# Patient Record
Sex: Female | Born: 1970 | Hispanic: Yes | Marital: Married | Smoking: Never smoker
Health system: Southern US, Community
[De-identification: ages and names within clinical notes are randomized; demographics above are authoritative.]

## PROBLEM LIST (undated history)

## (undated) DIAGNOSIS — N189 Chronic kidney disease, unspecified: Secondary | ICD-10-CM

## (undated) DIAGNOSIS — N289 Disorder of kidney and ureter, unspecified: Secondary | ICD-10-CM

## (undated) DIAGNOSIS — M1990 Unspecified osteoarthritis, unspecified site: Secondary | ICD-10-CM

## (undated) DIAGNOSIS — E041 Nontoxic single thyroid nodule: Secondary | ICD-10-CM

## (undated) DIAGNOSIS — Q613 Polycystic kidney, unspecified: Secondary | ICD-10-CM

## (undated) DIAGNOSIS — F32A Depression, unspecified: Secondary | ICD-10-CM

## (undated) HISTORY — DX: Nontoxic single thyroid nodule: E04.1

## (undated) HISTORY — DX: Polycystic kidney, unspecified: Q61.3

## (undated) HISTORY — DX: Chronic kidney disease, unspecified: N18.9

## (undated) HISTORY — DX: Depression, unspecified: F32.A

## (undated) HISTORY — PX: LAPAROSCOPIC OVARIAN CYSTECTOMY: SUR786

## (undated) HISTORY — DX: Unspecified osteoarthritis, unspecified site: M19.90

## (undated) HISTORY — PX: ABDOMINAL HYSTERECTOMY: SHX81

---

## 1992-01-17 HISTORY — PX: TONSILLECTOMY: SUR1361

## 1995-01-17 DIAGNOSIS — R87619 Unspecified abnormal cytological findings in specimens from cervix uteri: Secondary | ICD-10-CM

## 1995-01-17 HISTORY — PX: CERVIX LESION DESTRUCTION: SHX591

## 1995-01-17 HISTORY — DX: Unspecified abnormal cytological findings in specimens from cervix uteri: R87.619

## 2011-01-17 DIAGNOSIS — M1990 Unspecified osteoarthritis, unspecified site: Secondary | ICD-10-CM

## 2011-01-17 HISTORY — DX: Unspecified osteoarthritis, unspecified site: M19.90

## 2011-01-17 HISTORY — PX: ABDOMINAL HYSTERECTOMY: SHX81

## 2011-01-17 HISTORY — PX: TOTAL LAPAROSCOPIC HYSTERECTOMY WITH SALPINGECTOMY: SHX6742

## 2014-12-15 DIAGNOSIS — R7989 Other specified abnormal findings of blood chemistry: Secondary | ICD-10-CM | POA: Insufficient documentation

## 2015-04-06 DIAGNOSIS — E041 Nontoxic single thyroid nodule: Secondary | ICD-10-CM | POA: Insufficient documentation

## 2015-11-22 DIAGNOSIS — F331 Major depressive disorder, recurrent, moderate: Secondary | ICD-10-CM | POA: Insufficient documentation

## 2016-09-11 DIAGNOSIS — Z90710 Acquired absence of both cervix and uterus: Secondary | ICD-10-CM | POA: Insufficient documentation

## 2017-01-16 DIAGNOSIS — E041 Nontoxic single thyroid nodule: Secondary | ICD-10-CM

## 2017-01-16 HISTORY — DX: Nontoxic single thyroid nodule: E04.1

## 2017-04-05 DIAGNOSIS — N182 Chronic kidney disease, stage 2 (mild): Secondary | ICD-10-CM | POA: Insufficient documentation

## 2017-04-05 DIAGNOSIS — Q613 Polycystic kidney, unspecified: Secondary | ICD-10-CM | POA: Insufficient documentation

## 2017-06-20 DIAGNOSIS — F430 Acute stress reaction: Secondary | ICD-10-CM | POA: Insufficient documentation

## 2018-02-14 DIAGNOSIS — R1013 Epigastric pain: Secondary | ICD-10-CM | POA: Insufficient documentation

## 2018-02-14 DIAGNOSIS — K5901 Slow transit constipation: Secondary | ICD-10-CM | POA: Insufficient documentation

## 2018-10-01 LAB — HM MAMMOGRAPHY

## 2019-09-12 ENCOUNTER — Encounter: Payer: Self-pay | Admitting: Family Medicine

## 2019-09-12 ENCOUNTER — Telehealth: Payer: Self-pay

## 2019-09-12 ENCOUNTER — Other Ambulatory Visit: Payer: Self-pay

## 2019-09-12 ENCOUNTER — Ambulatory Visit: Payer: PRIVATE HEALTH INSURANCE | Admitting: Family Medicine

## 2019-09-12 VITALS — BP 116/78 | HR 60 | Temp 98.2°F | Ht 64.5 in | Wt 130.8 lb

## 2019-09-12 DIAGNOSIS — Q613 Polycystic kidney, unspecified: Secondary | ICD-10-CM

## 2019-09-12 DIAGNOSIS — E041 Nontoxic single thyroid nodule: Secondary | ICD-10-CM | POA: Diagnosis not present

## 2019-09-12 DIAGNOSIS — Z13220 Encounter for screening for lipoid disorders: Secondary | ICD-10-CM | POA: Diagnosis not present

## 2019-09-12 DIAGNOSIS — Z90710 Acquired absence of both cervix and uterus: Secondary | ICD-10-CM

## 2019-09-12 DIAGNOSIS — K219 Gastro-esophageal reflux disease without esophagitis: Secondary | ICD-10-CM

## 2019-09-12 DIAGNOSIS — Z8619 Personal history of other infectious and parasitic diseases: Secondary | ICD-10-CM

## 2019-09-12 DIAGNOSIS — Z0000 Encounter for general adult medical examination without abnormal findings: Secondary | ICD-10-CM

## 2019-09-12 DIAGNOSIS — R1013 Epigastric pain: Secondary | ICD-10-CM

## 2019-09-12 DIAGNOSIS — R232 Flushing: Secondary | ICD-10-CM

## 2019-09-12 LAB — LIPID PANEL
Cholesterol: 204 mg/dL — ABNORMAL HIGH (ref 0–200)
HDL: 56 mg/dL (ref >39.00)
LDL Cholesterol: 132 mg/dL — ABNORMAL HIGH (ref 0–99)
NonHDL: 148.02
Total CHOL/HDL Ratio: 4
Triglycerides: 81 mg/dL (ref 0.0–149.0)
VLDL: 16.2 mg/dL (ref 0.0–40.0)

## 2019-09-12 LAB — T4, FREE: Free T4: 0.89 ng/dL (ref 0.60–1.60)

## 2019-09-12 LAB — CBC WITH DIFFERENTIAL/PLATELET
Basophils Absolute: 0.1 10*3/uL (ref 0.0–0.1)
Basophils Relative: 1.1 % (ref 0.0–3.0)
Eosinophils Absolute: 0.2 10*3/uL (ref 0.0–0.7)
Eosinophils Relative: 1.9 % (ref 0.0–5.0)
HCT: 39.6 % (ref 36.0–46.0)
Hemoglobin: 13.3 g/dL (ref 12.0–15.0)
Lymphocytes Relative: 34.1 % (ref 12.0–46.0)
Lymphs Abs: 2.8 10*3/uL (ref 0.7–4.0)
MCHC: 33.7 g/dL (ref 30.0–36.0)
MCV: 87.3 fl (ref 78.0–100.0)
Monocytes Absolute: 0.4 10*3/uL (ref 0.1–1.0)
Monocytes Relative: 5.5 % (ref 3.0–12.0)
Neutro Abs: 4.7 10*3/uL (ref 1.4–7.7)
Neutrophils Relative %: 57.4 % (ref 43.0–77.0)
Platelets: 340 10*3/uL (ref 150.0–400.0)
RBC: 4.54 Mil/uL (ref 3.87–5.11)
RDW: 13.3 % (ref 11.5–15.5)
WBC: 8.2 10*3/uL (ref 4.0–10.5)

## 2019-09-12 LAB — COMPREHENSIVE METABOLIC PANEL
ALT: 14 U/L (ref 0–35)
AST: 17 U/L (ref 0–37)
Albumin: 4.5 g/dL (ref 3.5–5.2)
Alkaline Phosphatase: 58 U/L (ref 39–117)
BUN: 17 mg/dL (ref 6–23)
CO2: 26 mEq/L (ref 19–32)
Calcium: 10.1 mg/dL (ref 8.4–10.5)
Chloride: 104 mEq/L (ref 96–112)
Creatinine, Ser: 1.06 mg/dL (ref 0.40–1.20)
GFR: 54.86 mL/min — ABNORMAL LOW (ref >60.00)
Glucose, Bld: 87 mg/dL (ref 70–99)
Potassium: 4.8 mEq/L (ref 3.5–5.1)
Sodium: 140 mEq/L (ref 135–145)
Total Bilirubin: 0.7 mg/dL (ref 0.2–1.2)
Total Protein: 7.4 g/dL (ref 6.0–8.3)

## 2019-09-12 LAB — FOLLICLE STIMULATING HORMONE: FSH: >206 m[IU]/mL

## 2019-09-12 LAB — TSH: TSH: 0.35 u[IU]/mL (ref 0.35–4.50)

## 2019-09-12 LAB — VITAMIN D 25 HYDROXY (VIT D DEFICIENCY, FRACTURES): VITD: 87.61 ng/mL (ref 30.00–100.00)

## 2019-09-12 MED ORDER — SERTRALINE HCL 50 MG PO TABS: 75.0000 mg | ORAL_TABLET | Freq: Every day | ORAL | 3 refills | Status: DC

## 2019-09-12 MED ORDER — ESTRADIOL 1 MG PO TABS: 1.0000 mg | ORAL_TABLET | Freq: Every day | ORAL | 3 refills | Status: DC

## 2019-09-12 MED ORDER — VALACYCLOVIR HCL 500 MG PO TABS: 1000.0000 mg | ORAL_TABLET | ORAL | 1 refills | Status: DC | PRN

## 2019-09-12 NOTE — Telephone Encounter (Signed)
Request for Mammogram.

## 2019-09-12 NOTE — Progress Notes (Signed)
Subjective:    Patient ID: Diane Hood, female    DOB: 1970-02-09, 49 y.o.   MRN: [id number]  HPI Chief Complaint  Patient presents with  . New Patient (Initial Visit)    colon-2019--will bring copy.Marland KitchenMarland KitchenMamm--08/2019--[facility]--[place], [place]...   New to the area, moved in December. Lives with her husband and two children, one daughter, one son, 70, 83. Homemaker. Enjoys walking, yoga  Last CPE- within last year Mammo- 2020 Pap- within last 2 years Colonoscopy- 2019- per patient Tdap- unknown Flu- annual Covid 19 vaccine- fully vaccinated Eye- overdue Dental- regular Exercise- walking, yoga  CKD/ polycystic kidney disease- needs nephrology referral. Last kidney ultrasound unknown. Last follow up approximately 1 year ago. Reports that her kidney function has been normal.   "Hot nodule" of thyroid- no recent labs. No difficulty with swallowing.   History of hysterectomy- some hot flashes, no history of cancer.   Upper epigastric pain-occasional with bloating, worse with certain foods.  Takes probiotic.  Has been on omeprazole daily for intermittent heartburn.  Heartburn is currently very occasional.  Omeprazole does not help with epigastric symptoms.  Normal bowel movements without blood or mucus.  History of hysterectomy-occasional hot flashes, mood changes.  Has been on Estrace and sertraline for this.  Feels like she could use a higher dose of sertraline to help with symptoms.  History of cold sores a couple of times a year.  Requests refill of valacyclovir as needed.  Review of Systems  Constitutional: Negative.   HENT: Negative.   Eyes: Negative.   Respiratory: Negative.   Cardiovascular: Negative.   Gastrointestinal: Positive for abdominal pain.  Endocrine: Negative.   Genitourinary: Negative.   Musculoskeletal: Negative.   Skin: Negative.   Allergic/Immunologic: Positive for environmental allergies (intermittent, relieved with otc treatment).   Neurological: Negative.   Hematological: Negative.   Psychiatric/Behavioral: Positive for sleep disturbance. Negative for dysphoric mood. The patient is not nervous/anxious.        Objective:   Physical Exam Physical Exam  Constitutional: She is oriented to person, place, and time. She appears well-developed and well-nourished. No distress.  HENT:  Head: Normocephalic and atraumatic.  Right Ear: External ear normal. TM normal.  Left Ear: External ear normal. TM normal.  Nose: Nose normal.  Mouth/Throat: Oropharynx is clear and moist. No oropharyngeal exudate.  Eyes: Conjunctivae are normal.   Neck: Normal range of motion. Neck supple. No JVD present. No thyromegaly present.  Cardiovascular: Normal rate, regular rhythm, normal heart sounds and intact distal pulses.   Pulmonary/Chest: Effort normal and breath sounds normal.  Abdominal: Soft. Bowel sounds are normal. She exhibits no distension and no mass. There is no tenderness. There is no rebound and no guarding.  Musculoskeletal: Normal range of motion. She exhibits no edema or tenderness.  Lymphadenopathy:    She has no cervical adenopathy.  Neurological: She is alert and oriented to person, place, and time.   Skin: Skin is warm and dry. She is not diaphoretic.  Psychiatric: She has a normal mood and affect. Her behavior is normal. Judgment and thought content normal.  Vitals reviewed.    BP 116/78   Pulse 60   Temp 98.2 F (36.8 C) (Temporal)   Ht 5' 4.5" (1.638 m)   Wt 130 lb 12 oz (59.3 kg)   SpO2 99%   BMI 22.10 kg/m   Depression screen [facility] 2/9 09/12/2019  Decreased Interest 0  Down, Depressed, Hopeless 0  PHQ - 2 Score 0  Assessment & Plan:  1. Annual physical exam - Discussed and encouraged healthy lifestyle choices- adequate sleep, regular exercise, stress management and healthy food choices.  -We will obtain records from prior PCP and nephrologist  2. Polycystic kidney disease - Ambulatory referral  to Nephrology - Comprehensive metabolic panel - CBC with Differential - Vitamin D, 25-hydroxy  3. Screening for lipid disorders - Lipid Panel  4. Thyroid nodule - TSH - T4, Free - T3; Future  5. History of hysterectomy - Follicle stimulating hormone  6. H/O cold sores - valACYclovir (VALTREX) 500 MG tablet; Take 2 tablets (1,000 mg total) by mouth as needed.  Dispense: 20 tablet; Refill: 1  7. Vasomotor flushing - sertraline (ZOLOFT) 50 MG tablet; Take 1.5 tablets (75 mg total) by mouth daily.  Dispense: 135 tablet; Refill: 3 - estradiol (ESTRACE) 1 MG tablet; Take 1 tablet (1 mg total) by mouth daily.  Dispense: 90 tablet; Refill: 3 -We will continue estradiol, increase sertraline from 50 mg to 75 mg.  Follow-up in 6 months.  8.  Epigastric pain -Intermittent and longstanding.  Discussed trigger avoidance, can continue probiotics -If any worsening or no improvement, consider ultrasound  9.  GERD -Symptoms infrequent, discussed weaning daily omeprazole to every other day for a while and then just as needed  This visit occurred during the SARS-CoV-2 public health emergency.  Safety protocols were in place, including screening questions prior to the visit, additional usage of staff PPE, and extensive cleaning of exam room while observing appropriate contact time as indicated for disinfecting solutions.    Olean Ree, FNP-BC  [facility] [address] [facility], [facility]  09/14/2019 8:32 AM

## 2019-09-12 NOTE — Patient Instructions (Addendum)
Please call and schedule an appointment for screening mammogram. A referral is not needed.  [phone] [phone]  I have put in referral to nephrologist.   Try 1-2 teaspoons of apple cider vinegar in your water daily  Try to decrease your omperazole to every other day then use only as needed  Follow up in 6 months

## 2019-09-14 ENCOUNTER — Encounter: Payer: Self-pay | Admitting: Family Medicine

## 2019-11-12 DIAGNOSIS — N1831 Chronic kidney disease, stage 3a: Secondary | ICD-10-CM | POA: Insufficient documentation

## 2019-11-18 ENCOUNTER — Other Ambulatory Visit (HOSPITAL_COMMUNITY): Payer: Self-pay | Admitting: Nephrology

## 2019-11-18 ENCOUNTER — Other Ambulatory Visit: Payer: Self-pay | Admitting: Nephrology

## 2019-11-18 DIAGNOSIS — N1831 Chronic kidney disease, stage 3a: Secondary | ICD-10-CM

## 2019-11-18 DIAGNOSIS — R8290 Unspecified abnormal findings in urine: Secondary | ICD-10-CM

## 2019-11-18 DIAGNOSIS — Q612 Polycystic kidney, adult type: Secondary | ICD-10-CM

## 2019-11-24 ENCOUNTER — Encounter: Payer: Self-pay | Admitting: Family Medicine

## 2019-12-04 ENCOUNTER — Other Ambulatory Visit: Payer: Self-pay | Admitting: Family Medicine

## 2019-12-04 DIAGNOSIS — Z8619 Personal history of other infectious and parasitic diseases: Secondary | ICD-10-CM

## 2020-01-29 ENCOUNTER — Ambulatory Visit
Admission: RE | Admit: 2020-01-29 | Discharge: 2020-01-29 | Disposition: A | Discharge status: Home / Self Care | Payer: PRIVATE HEALTH INSURANCE | Source: Ambulatory Visit | Attending: Nephrology | Admitting: Nephrology

## 2020-01-29 ENCOUNTER — Other Ambulatory Visit: Payer: Self-pay

## 2020-01-29 DIAGNOSIS — R8290 Unspecified abnormal findings in urine: Secondary | ICD-10-CM | POA: Diagnosis present

## 2020-01-29 DIAGNOSIS — N1831 Chronic kidney disease, stage 3a: Secondary | ICD-10-CM | POA: Diagnosis present

## 2020-01-29 DIAGNOSIS — Q612 Polycystic kidney, adult type: Secondary | ICD-10-CM | POA: Insufficient documentation

## 2020-02-11 ENCOUNTER — Telehealth: Payer: Self-pay | Admitting: Family Medicine

## 2020-02-11 NOTE — Telephone Encounter (Signed)
Ok to do. Thanks.  

## 2020-02-11 NOTE — Telephone Encounter (Signed)
Patient is doing a TOC to you. Patient wants to know if you would be interested in taking her husband on as a new patient? Please advise. Thank you, EM

## 2020-03-15 ENCOUNTER — Ambulatory Visit: Payer: PRIVATE HEALTH INSURANCE | Admitting: Family Medicine

## 2020-04-13 ENCOUNTER — Other Ambulatory Visit: Payer: Self-pay

## 2020-04-13 ENCOUNTER — Encounter: Payer: Self-pay | Admitting: Family Medicine

## 2020-04-13 ENCOUNTER — Ambulatory Visit: Payer: PRIVATE HEALTH INSURANCE | Admitting: Family Medicine

## 2020-04-13 VITALS — BP 110/70 | HR 77 | Temp 98.2°F | Ht 64.5 in | Wt 131.3 lb

## 2020-04-13 DIAGNOSIS — Q613 Polycystic kidney, unspecified: Secondary | ICD-10-CM

## 2020-04-13 DIAGNOSIS — E0590 Thyrotoxicosis, unspecified without thyrotoxic crisis or storm: Secondary | ICD-10-CM | POA: Diagnosis not present

## 2020-04-13 DIAGNOSIS — N182 Chronic kidney disease, stage 2 (mild): Secondary | ICD-10-CM | POA: Diagnosis not present

## 2020-04-13 DIAGNOSIS — J309 Allergic rhinitis, unspecified: Secondary | ICD-10-CM | POA: Diagnosis not present

## 2020-04-13 MED ORDER — OMEPRAZOLE 20 MG PO CPDR: 20.0000 mg | DELAYED_RELEASE_CAPSULE | Freq: Every day | ORAL | 6 refills | Status: DC | PRN

## 2020-04-13 NOTE — Patient Instructions (Addendum)
Laboratorios hoy para revisar nivel de la tiroides. La remitiremos a endocrinologo para establecer para tiroides sobreactivo.  Gusto verla hoy! Regresar en septiembre para proximo fisico

## 2020-04-13 NOTE — Progress Notes (Signed)
Patient ID: Diane Hood, female    DOB: September 11, 1970, 50 y.o.   MRN: [id number]  This visit was conducted in person.  BP 110/70   Pulse 77   Temp 98.2 F (36.8 C) (Temporal)   Ht 5' 4.5" (1.638 m)   Wt 131 lb 5 oz (59.6 kg)   SpO2 97%   BMI 22.19 kg/m    CC: transfer care  Subjective:   HPI: Diane Hood is a 50 y.o. female presenting on 04/13/2020 for Transitions Of Care (TOC from [place]. )   Previously saw Deboraha Sprang.  Last CPE 08/2019.   CKD /polycystic kidney disease followed by nephrologist [facility]) - no need for medications at this time. Lab work Q6 months.   Subclinical hyperthyroidism - brings records latest thyroid uptake scan: Nuclear thyroid uptake scan 11/2017 - mod enlarged gland, fairly homogenous uptake throughout except for a focal area of increased uptake at L lower pole corresponding to sonographically demonstrated L lower pole nodule. 6 hour radioactive iodine uptake is normal at 14%, 24 hour radioactive iodine uptake is also normal at 29%.  Denies diarrhea, constipation. Ongoing hemorrhoids.  Good fiber intake.   Perimenopausal - ongoing hot flashes. S/p hysterectomy age 50.   Predominantly notes hands and feet stay cold.  Notes easy abdominal distension with certain foods.   Allergies - manages with zyrtec, flonase.   3 mo h/o L toenail change  Colonoscopy 2018 - WNL in [place] to the area 12/2018 from [place] [facility]) prior lived in [place] Lives with husband 2 children  Occ: housewife  Activity: yoga, walking Diet: good water, fruits/vegetables daily      Relevant past medical, surgical, family and social history reviewed and updated as indicated. Interim medical history since our last visit reviewed. Allergies and medications reviewed and updated. Outpatient Medications Prior to Visit  Medication Sig Dispense Refill  . Cholecalciferol 125 MCG (5000 UT) TABS 1 Tablet(s) P.O. daily    . estradiol (ESTRACE) 1 MG  tablet Take 1 tablet (1 mg total) by mouth daily. (Patient taking differently: Take 1 mg by mouth daily. As needed) 90 tablet 3  . sertraline (ZOLOFT) 50 MG tablet Take 1.5 tablets (75 mg total) by mouth daily. (Patient taking differently: Take 75 mg by mouth daily. As needed) 135 tablet 3  . valACYclovir (VALTREX) 500 MG tablet TAKE 2 TABLETS BY MOUTH AS NEEDED 20 tablet 1  . omeprazole (PRILOSEC) 20 MG capsule As needed     No facility-administered medications prior to visit.     Per HPI unless specifically indicated in ROS section below Review of Systems Objective:  BP 110/70   Pulse 77   Temp 98.2 F (36.8 C) (Temporal)   Ht 5' 4.5" (1.638 m)   Wt 131 lb 5 oz (59.6 kg)   SpO2 97%   BMI 22.19 kg/m   Wt Readings from Last 3 Encounters:  04/13/20 131 lb 5 oz (59.6 kg)  09/12/19 130 lb 12 oz (59.3 kg)      Physical Exam Vitals and nursing note reviewed.  Constitutional:      Appearance: Normal appearance. She is not ill-appearing.  Neck:     Thyroid: No thyroid mass, thyromegaly or thyroid tenderness.  Cardiovascular:     Rate and Rhythm: Regular rhythm. Tachycardia present.     Pulses: Normal pulses.     Heart sounds: Normal heart sounds. No murmur heard.   Pulmonary:     Effort: Pulmonary effort is normal.  No respiratory distress.     Breath sounds: Normal breath sounds. No wheezing, rhonchi or rales.  Skin:    General: Skin is warm and dry.     Findings: No rash.  Neurological:     Mental Status: She is alert.  Psychiatric:        Mood and Affect: Mood normal.        Behavior: Behavior normal.       Results for orders placed or performed in visit on 04/13/20  TSH  Result Value Ref Range   TSH 0.64 0.35 - 4.50 uIU/mL  T3  Result Value Ref Range   T3, Total 121 76 - 181 ng/dL  T4, free  Result Value Ref Range   Free T4 0.88 0.60 - 1.60 ng/dL   Assessment & Plan:  This visit occurred during the SARS-CoV-2 public health emergency.  Safety protocols were  in place, including screening questions prior to the visit, additional usage of staff PPE, and extensive cleaning of exam room while observing appropriate contact time as indicated for disinfecting solutions.   Problem List Items Addressed This Visit    Chronic kidney disease, stage 2 (mild)    Continue to monitor in h/o PCKD      Polycystic kidney    Followed by nephrology ([facility]) q53mo No need for medication at this time.       Subclinical hyperthyroidism - Primary    H/o subclinical hypothyroidism with hot L lower thyroid nodule on thyroid scintigraphy 2019. Update thyroid function. Discussed endo referral for further management.       Relevant Orders   TSH (Completed)   T3 (Completed)   T4, free (Completed)   Ambulatory referral to Endocrinology   Allergic rhinitis    Managed with OTC remedies  Discussed nasal saline irrigation and allergen avoidance measures          Meds ordered this encounter  Medications  . omeprazole (PRILOSEC) 20 MG capsule    Sig: Take 1 capsule (20 mg total) by mouth daily as needed (heartburn).    Dispense:  30 capsule    Refill:  6  . cetirizine (ZYRTEC) 10 MG tablet    Sig: Take 1 tablet (10 mg total) by mouth daily.  . fluticasone (FLONASE) 50 MCG/ACT nasal spray    Sig: Place 2 sprays into both nostrils daily.   Orders Placed This Encounter  Procedures  . TSH  . T3  . T4, free  . Ambulatory referral to Endocrinology    Referral Priority:   Routine    Referral Type:   Consultation    Referral Reason:   Specialty Services Required    Number of Visits Requested:   1    Patient Instructions  Laboratorios hoy para revisar nivel de la tiroides. La remitiremos a endocrinologo para establecer para tiroides sobreactivo.  Gusto verla hoy! Regresar en septiembre para proximo fisico    Follow up plan: Return if symptoms worsen or fail to improve.  Eustaquio Boyden, MD

## 2020-04-14 LAB — TSH: TSH: 0.64 u[IU]/mL (ref 0.35–4.50)

## 2020-04-14 LAB — T3: T3, Total: 121 ng/dL (ref 76–181)

## 2020-04-14 LAB — T4, FREE: Free T4: 0.88 ng/dL (ref 0.60–1.60)

## 2020-04-15 DIAGNOSIS — E0590 Thyrotoxicosis, unspecified without thyrotoxic crisis or storm: Secondary | ICD-10-CM | POA: Insufficient documentation

## 2020-04-15 DIAGNOSIS — J309 Allergic rhinitis, unspecified: Secondary | ICD-10-CM | POA: Insufficient documentation

## 2020-04-15 MED ORDER — CETIRIZINE HCL 10 MG PO TABS: 10.0000 mg | ORAL_TABLET | Freq: Every day | ORAL | Status: DC

## 2020-04-15 MED ORDER — FLUTICASONE PROPIONATE 50 MCG/ACT NA SUSP: 2.0000 | Freq: Every day | NASAL | Status: DC

## 2020-04-15 NOTE — Assessment & Plan Note (Signed)
Continue to monitor in h/o PCKD

## 2020-04-15 NOTE — Assessment & Plan Note (Addendum)
Managed with OTC remedies  Discussed nasal saline irrigation and allergen avoidance measures

## 2020-04-15 NOTE — Assessment & Plan Note (Addendum)
Followed by nephrology (Kolluru) q94mo No need for medication at this time.

## 2020-04-15 NOTE — Assessment & Plan Note (Signed)
H/o subclinical hypothyroidism with hot L lower thyroid nodule on thyroid scintigraphy 2019. Update thyroid function. Discussed endo referral for further management.

## 2020-05-19 ENCOUNTER — Encounter: Payer: Self-pay | Admitting: Family Medicine

## 2020-05-20 NOTE — Telephone Encounter (Signed)
Noticing inflamed stomach and blood with wiping. UTD colonoscopy 2018  Advised to call and schedule appt.

## 2020-05-20 NOTE — Telephone Encounter (Signed)
plz schedule next available appt with any provider for abd pain.

## 2020-05-21 NOTE — Telephone Encounter (Signed)
Called and offered patient appt in [place] with DR Saguier today at 11:30 patient denied visit and wanted to see Dr Diane Hood at next available. I even offered someone else here and she denied the visit

## 2020-05-25 NOTE — Telephone Encounter (Signed)
Please schedule appt for tomorrow as I have some openings then.

## 2020-05-26 ENCOUNTER — Other Ambulatory Visit: Payer: Self-pay

## 2020-05-26 ENCOUNTER — Ambulatory Visit
Admission: RE | Admit: 2020-05-26 | Discharge: 2020-05-26 | Disposition: A | Discharge status: Home / Self Care | Payer: PRIVATE HEALTH INSURANCE | Source: Ambulatory Visit | Attending: Family Medicine | Admitting: Family Medicine

## 2020-05-26 ENCOUNTER — Encounter: Payer: Self-pay | Admitting: Family Medicine

## 2020-05-26 ENCOUNTER — Ambulatory Visit: Payer: PRIVATE HEALTH INSURANCE | Admitting: Family Medicine

## 2020-05-26 VITALS — BP 122/80 | HR 64 | Temp 98.0°F | Ht 64.5 in | Wt 132.3 lb

## 2020-05-26 DIAGNOSIS — R911 Solitary pulmonary nodule: Secondary | ICD-10-CM | POA: Insufficient documentation

## 2020-05-26 DIAGNOSIS — N182 Chronic kidney disease, stage 2 (mild): Secondary | ICD-10-CM | POA: Diagnosis not present

## 2020-05-26 DIAGNOSIS — R1013 Epigastric pain: Secondary | ICD-10-CM | POA: Insufficient documentation

## 2020-05-26 DIAGNOSIS — R140 Abdominal distension (gaseous): Secondary | ICD-10-CM | POA: Insufficient documentation

## 2020-05-26 DIAGNOSIS — Q613 Polycystic kidney, unspecified: Secondary | ICD-10-CM | POA: Diagnosis not present

## 2020-05-26 HISTORY — DX: Disorder of kidney and ureter, unspecified: N28.9

## 2020-05-26 LAB — POC URINALSYSI DIPSTICK (AUTOMATED)
Bilirubin, UA: NEGATIVE
Blood, UA: NEGATIVE
Glucose, UA: NEGATIVE
Ketones, UA: NEGATIVE
Leukocytes, UA: NEGATIVE
Nitrite, UA: NEGATIVE
Protein, UA: NEGATIVE
Spec Grav, UA: 1.015 (ref 1.010–1.025)
Urobilinogen, UA: 0.2 E.U./dL
pH, UA: 7.5 (ref 5.0–8.0)

## 2020-05-26 LAB — POCT I-STAT CREATININE: Creatinine, Ser: 1 mg/dL (ref 0.44–1.00)

## 2020-05-26 MED ORDER — IOHEXOL 300 MG/ML  SOLN
100.0000 mL | Freq: Once | INTRAMUSCULAR | Status: AC | PRN
  Administered 2020-05-26: 100 mL via INTRAVENOUS

## 2020-05-26 NOTE — Progress Notes (Addendum)
Patient ID: Diane Hood, female    DOB: 1970-08-19, 50 y.o.   MRN: [id number]  This visit was conducted in person.  BP 122/80   Pulse 64   Temp 98 F (36.7 C) (Temporal)   Ht 5' 4.5" (1.638 m)   Wt 132 lb 5 oz (60 kg)   SpO2 99%   BMI 22.36 kg/m    CC: bloating, gassiness, abd pain  Subjective:   HPI: Diane Hood is a 50 y.o. female presenting on 05/26/2020 for GI Problem (C/o bloating, excessive flatulence and abd pain in umbilical region.  Started about 3 wks ago. )   3 wk h/o epigastric abd pain associated with generalized abdominal bloating to periumbilical region. Describes pressure like pain with radiation throughout abdomen. no boring pain to back, no significant RUQ pain. Symptoms acutely worse yesterday. Some blood with wiping that has since resolved (h/o int hem).   She restarted omeprazole 20mg  daily for almost 1 week without benefit.  No fevers/chills, nausea/vomiting, diarrhea, constipation, change in appetite, unexpected weight changes. No UTI symptoms.  No significant GERD/heartburn.  No recent travel.   Over the past few weeks has sharp pain to left flank like someone hit her - attriubutes to ruptured kidney cysts -  area stays sore for a few days. Treats with tylenol, increased water intake.   Similar symptoms 2007 - found to have H pylori after trip to [place].  Uses estradiol 1mg  daily PRN hot flashes.   Colonoscopy 2018 - WNL in S/p hysterectomy      Relevant past medical, surgical, family and social history reviewed and updated as indicated. Interim medical history since our last visit reviewed. Allergies and medications reviewed and updated. Outpatient Medications Prior to Visit  Medication Sig Dispense Refill  . cetirizine (ZYRTEC) 10 MG tablet Take 1 tablet (10 mg total) by mouth daily. (Patient taking differently: Take 10 mg by mouth daily. As needed)    . Cholecalciferol 125 MCG (5000 UT) TABS 1 Tablet(s) P.O. daily    .  estradiol (ESTRACE) 1 MG tablet Take 1 tablet (1 mg total) by mouth daily. (Patient taking differently: Take 1 mg by mouth daily. As needed) 90 tablet 3  . fluticasone (FLONASE) 50 MCG/ACT nasal spray Place 2 sprays into both nostrils daily. (Patient taking differently: Place 2 sprays into both nostrils daily. As needed)    . omeprazole (PRILOSEC) 20 MG capsule Take 1 capsule (20 mg total) by mouth daily as needed (heartburn). 30 capsule 6  . sertraline (ZOLOFT) 50 MG tablet Take 1.5 tablets (75 mg total) by mouth daily. (Patient taking differently: Take 75 mg by mouth daily. As needed) 135 tablet 3  . valACYclovir (VALTREX) 500 MG tablet TAKE 2 TABLETS BY MOUTH AS NEEDED 20 tablet 1   No facility-administered medications prior to visit.     Per HPI unless specifically indicated in ROS section below Review of Systems Objective:  BP 122/80   Pulse 64   Temp 98 F (36.7 C) (Temporal)   Ht 5' 4.5" (1.638 m)   Wt 132 lb 5 oz (60 kg)   SpO2 99%   BMI 22.36 kg/m   Wt Readings from Last 3 Encounters:  05/26/20 132 lb 5 oz (60 kg)  04/13/20 131 lb 5 oz (59.6 kg)  09/12/19 130 lb 12 oz (59.3 kg)      Physical Exam Vitals and nursing note reviewed.  Constitutional:      Appearance: Normal appearance.  Comments: Uncomfortable appearing  Cardiovascular:     Rate and Rhythm: Normal rate and regular rhythm.     Pulses: Normal pulses.     Heart sounds: Normal heart sounds. No murmur heard.   Pulmonary:     Effort: Pulmonary effort is normal. No respiratory distress.     Breath sounds: Normal breath sounds. No wheezing, rhonchi or rales.  Abdominal:     General: Bowel sounds are increased. There is distension.     Palpations: Abdomen is soft. There is no mass.     Tenderness: There is abdominal tenderness (moderate) in the epigastric area and periumbilical area. There is no guarding or rebound.     Hernia: No hernia is present.  Musculoskeletal:     Right lower leg: No edema.      Left lower leg: No edema.  Skin:    General: Skin is warm and dry.     Findings: No rash.  Neurological:     Mental Status: She is alert.  Psychiatric:        Mood and Affect: Mood normal.        Behavior: Behavior normal.       Results for orders placed or performed in visit on 05/26/20  POCT Urinalysis Dipstick (Automated)  Result Value Ref Range   Color, UA yellow    Clarity, UA clear    Glucose, UA Negative Negative   Bilirubin, UA negative    Ketones, UA negative    Spec Grav, UA 1.015 1.010 - 1.025   Blood, UA negative    pH, UA 7.5 5.0 - 8.0   Protein, UA Negative Negative   Urobilinogen, UA 0.2 0.2 or 1.0 E.U./dL   Nitrite, UA negative    Leukocytes, UA Negative Negative   Assessment & Plan:  This visit occurred during the SARS-CoV-2 public health emergency.  Safety protocols were in place, including screening questions prior to the visit, additional usage of staff PPE, and extensive cleaning of exam room while observing appropriate contact time as indicated for disinfecting solutions.   Problem List Items Addressed This Visit    Chronic kidney disease, stage 2 (mild)   Polycystic kidney disease    Feels several cysts have recently ruptured (L flank pain) - CT will also evaluate for this.       Relevant Orders   POCT Urinalysis Dipstick (Automated) (Completed)   Epigastric pain - Primary    3 wk h/o acute epigastric pain associated with marked bloating, abdominal distension, acutely worse over last 24 hours. Check labwork today as well as UA.  Check contrasted CT scan r/o pancreatitis, perforated viscus, mass. Not consistent with biliary colic.  Hold omeprazole - may need H pylori checked after 1 wk PPI hold in h/o same s/p treatment.       Relevant Orders   Comprehensive metabolic panel   CBC with Differential/Platelet   Lipase   CT Abdomen Pelvis W Contrast    Other Visit Diagnoses    Abdominal distension       Relevant Orders   CT Abdomen Pelvis W  Contrast   Abdominal bloating       Relevant Orders   CT Abdomen Pelvis W Contrast       No orders of the defined types were placed in this encounter.  Orders Placed This Encounter  Procedures  . CT Abdomen Pelvis W Contrast    Standing Status:   Future    Standing Expiration Date:   05/26/2021  Order Specific Question:   If indicated for the ordered procedure, I authorize the administration of contrast media per Radiology protocol    Answer:   Yes    Order Specific Question:   Is patient pregnant?    Answer:   No    Order Specific Question:   Preferred imaging location?    Answer:   [facility]    Order Specific Question:   Is Oral Contrast requested for this exam?    Answer:   Yes, Per Radiology protocol    Order Specific Question:   Call Results- Best Contact Number?    Answer:   [phone]...Marland KitchenMarland KitchenMarland Kitchenhold patient  . Comprehensive metabolic panel  . CBC with Differential/Platelet  . Lipase  . POCT Urinalysis Dipstick (Automated)    Patient Instructions  Urinalysis today  Labs today Quiero que hagamos CT scan de abdomen/pelvis para evaluar estos sintomas.  Pare omeprazole por ahora.    Follow up plan: No follow-ups on file.  Eustaquio Boyden, MD

## 2020-05-26 NOTE — Assessment & Plan Note (Addendum)
Feels several cysts have recently ruptured (L flank pain) - CT will also evaluate for this.

## 2020-05-26 NOTE — Addendum Note (Signed)
Addended by: Nanci Pina on: 05/26/2020 04:12 PM   Modules accepted: Orders

## 2020-05-26 NOTE — Patient Instructions (Signed)
Urinalysis today  Labs today Quiero que hagamos CT scan de abdomen/pelvis para evaluar estos sintomas.  Pare omeprazole por ahora.

## 2020-05-26 NOTE — Assessment & Plan Note (Addendum)
3 wk h/o acute epigastric pain associated with marked bloating, abdominal distension, acutely worse over last 24 hours. Check labwork today as well as UA.  Check contrasted CT scan r/o pancreatitis, perforated viscus, mass. Not consistent with biliary colic.  Hold omeprazole - may need H pylori checked after 1 wk PPI hold in h/o same s/p treatment.

## 2020-05-27 LAB — COMPREHENSIVE METABOLIC PANEL
ALT: 15 U/L (ref 0–35)
AST: 19 U/L (ref 0–37)
Albumin: 4.4 g/dL (ref 3.5–5.2)
Alkaline Phosphatase: 69 U/L (ref 39–117)
BUN: 21 mg/dL (ref 6–23)
CO2: 29 mEq/L (ref 19–32)
Calcium: 9.3 mg/dL (ref 8.4–10.5)
Chloride: 106 mEq/L (ref 96–112)
Creatinine, Ser: 1.08 mg/dL (ref 0.40–1.20)
GFR: 59.79 mL/min — ABNORMAL LOW (ref >60.00)
Glucose, Bld: 86 mg/dL (ref 70–99)
Potassium: 3.7 mEq/L (ref 3.5–5.1)
Sodium: 142 mEq/L (ref 135–145)
Total Bilirubin: 0.3 mg/dL (ref 0.2–1.2)
Total Protein: 7.3 g/dL (ref 6.0–8.3)

## 2020-05-27 LAB — CBC WITH DIFFERENTIAL/PLATELET
Basophils Absolute: 0.1 10*3/uL (ref 0.0–0.1)
Basophils Relative: 0.9 % (ref 0.0–3.0)
Eosinophils Absolute: 0.3 10*3/uL (ref 0.0–0.7)
Eosinophils Relative: 2.7 % (ref 0.0–5.0)
HCT: 39.5 % (ref 36.0–46.0)
Hemoglobin: 13.4 g/dL (ref 12.0–15.0)
Lymphocytes Relative: 35 % (ref 12.0–46.0)
Lymphs Abs: 3.3 10*3/uL (ref 0.7–4.0)
MCHC: 33.9 g/dL (ref 30.0–36.0)
MCV: 87.7 fl (ref 78.0–100.0)
Monocytes Absolute: 0.5 10*3/uL (ref 0.1–1.0)
Monocytes Relative: 4.9 % (ref 3.0–12.0)
Neutro Abs: 5.4 10*3/uL (ref 1.4–7.7)
Neutrophils Relative %: 56.5 % (ref 43.0–77.0)
Platelets: 368 10*3/uL (ref 150.0–400.0)
RBC: 4.5 Mil/uL (ref 3.87–5.11)
RDW: 13.5 % (ref 11.5–15.5)
WBC: 9.5 10*3/uL (ref 4.0–10.5)

## 2020-05-27 LAB — LIPASE: Lipase: 62 U/L — ABNORMAL HIGH (ref 11.0–59.0)

## 2020-05-28 ENCOUNTER — Ambulatory Visit: Payer: PRIVATE HEALTH INSURANCE | Admitting: Family Medicine

## 2020-06-16 ENCOUNTER — Other Ambulatory Visit: Payer: Self-pay | Admitting: Family Medicine

## 2020-06-16 DIAGNOSIS — R1013 Epigastric pain: Secondary | ICD-10-CM

## 2020-06-16 MED ORDER — HYDROCORTISONE ACETATE 25 MG RE SUPP: 25.0000 mg | Freq: Two times a day (BID) | RECTAL | 0 refills | Status: DC | PRN

## 2020-06-18 ENCOUNTER — Other Ambulatory Visit (INDEPENDENT_AMBULATORY_CARE_PROVIDER_SITE_OTHER): Payer: PRIVATE HEALTH INSURANCE

## 2020-06-18 DIAGNOSIS — R1013 Epigastric pain: Secondary | ICD-10-CM

## 2020-06-21 LAB — HELICOBACTER PYLORI  SPECIAL ANTIGEN
MICRO NUMBER:: 11966559
SPECIMEN QUALITY: ADEQUATE

## 2020-06-22 ENCOUNTER — Other Ambulatory Visit: Payer: Self-pay | Admitting: Family Medicine

## 2020-06-22 DIAGNOSIS — K6289 Other specified diseases of anus and rectum: Secondary | ICD-10-CM

## 2020-06-22 DIAGNOSIS — R1013 Epigastric pain: Secondary | ICD-10-CM

## 2020-06-23 ENCOUNTER — Encounter: Payer: Self-pay | Admitting: *Deleted

## 2020-07-26 ENCOUNTER — Other Ambulatory Visit: Payer: Self-pay

## 2020-07-26 DIAGNOSIS — R232 Flushing: Secondary | ICD-10-CM

## 2020-07-26 NOTE — Telephone Encounter (Signed)
New message     1. Which medications need to be refilled? (please list name of each medication and dose if known) sertraline (ZOLOFT) 50 MG tablet  2. Which pharmacy/location (including street and city if local pharmacy) is medication to be sent to? [facility] in [place]   3. Do they need a 30 day or 90 day supply? 30 days supply   4. Patient is out of medication    5. Last office visit 5.11.2022

## 2020-07-27 ENCOUNTER — Ambulatory Visit: Payer: PRIVATE HEALTH INSURANCE | Admitting: Gastroenterology

## 2020-07-28 NOTE — Telephone Encounter (Signed)
Last OV - 05/26/20 Next OV - 04/19/2021 Last Filled - 09/12/2019

## 2020-07-29 MED ORDER — SERTRALINE HCL 50 MG PO TABS: 75.0000 mg | ORAL_TABLET | Freq: Every day | ORAL | 3 refills | Status: DC

## 2020-08-05 ENCOUNTER — Ambulatory Visit (INDEPENDENT_AMBULATORY_CARE_PROVIDER_SITE_OTHER): Payer: No Typology Code available for payment source | Admitting: Gastroenterology

## 2020-08-05 ENCOUNTER — Other Ambulatory Visit: Payer: Self-pay

## 2020-08-05 ENCOUNTER — Encounter: Payer: Self-pay | Admitting: Gastroenterology

## 2020-08-05 VITALS — BP 119/70 | HR 61 | Temp 98.2°F | Ht 64.5 in | Wt 130.0 lb

## 2020-08-05 DIAGNOSIS — R1013 Epigastric pain: Secondary | ICD-10-CM | POA: Diagnosis not present

## 2020-08-05 DIAGNOSIS — R140 Abdominal distension (gaseous): Secondary | ICD-10-CM

## 2020-08-05 NOTE — Patient Instructions (Signed)
Charcoal tablets or capsules What is this medication? CHARCOAL (CHAR kole) is a dietary supplement. It is used to absorb gases in the stomach that cause stomach gas. Do not use this supplement to treat poisoningsor overdose. The FDA has not approved this supplement for any medical use. This medicine may be used for other purposes; ask your health care provider orpharmacist if you have questions. COMMON BRAND NAME(S): Charcoal Plus DS, CharcoCap What should I tell my care team before I take this medication? They need to know if you have any of these conditions: food or medicine poisoning have frequent heartburn or gas have recently traveled to another country stomach or intestinal disease an unusual or allergic reaction to charcoal, other medicines, foods, dyes, or preservatives pregnant or trying to get pregnant breast-feeding How should I use this medication? Take by mouth with a glass of water. Follow the directions on the package label or use as directed by a health care provider. Do not take this medicine moreoften than directed. Talk to your pediatrician regarding the use of this medicine in children.Special care may be needed. Overdosage: If you think you have taken too much of this medicine contact apoison control center or emergency room at once. NOTE: This medicine is only for you. Do not share this medicine with others. What if I miss a dose? If you miss a dose, take it as soon as you can. If it is almost time for yournext dose, take only that dose. Do not take double or extra doses. What may interact with this medication? Do not take this medicine with any of the following medications: ipecac This medicine may also interact with the following medications: acarbose aripiprazole birth control pills carbamazepine dapsone digoxin olanzapine phenothiazines like chlorpromazine, mesoridazine, prochlorperazine, thioridazine phenytoin pindolol some herbal medicines or dietary  supplements theophylline ursodeoxycholic acid This list may not describe all possible interactions. Give your health care provider a list of all the medicines, herbs, non-prescription drugs, or dietary supplements you use. Also tell them if you smoke, drink alcohol, or use illegaldrugs. Some items may interact with your medicine. What should I watch for while using this medication? Tell your doctor or healthcare professional if your symptoms do not start to get better or if they get worse. See your doctor if your symptoms last for 3days. Do not use this medicine to treat a poisoning or overdose. Get emergency help. This medicine may bind to other medicines or dairy products in the stomach. Do not take any other medicines for at least 2 hours before or after taking this medicine. Do not eat or drink milk, cheese, or other diary for at least 2 hoursbefore or after taking this medicine. Herbal or dietary supplements are not regulated like medicines. Rigid quality control standards are not required for dietary supplements. The purity and strength of these products can vary. The safety and effect of this dietary supplement for a certain disease or illness is not well known. This product isnot intended to diagnose, treat, cure or prevent any disease. The Food and Drug Administration suggests the following to help consumersprotect themselves: Always read product labels and follow directions. Natural does not mean a product is safe for humans to take. Look for products that include USP after the ingredient name. This means that the manufacturer followed the standards of the [place] Pharmacopoeia. Supplements made or sold by a nationally known food or drug company are more likely to be made under tight controls. You can write to the company  for more information about how the product was made. What side effects may I notice from receiving this medication? Side effects that you should report to your doctor or health  care professionalas soon as possible: allergic reactions like skin rash, itching or hives, swelling of the face, lips, or tongue Side effects that usually do not require medical attention (report to yourdoctor or health care professional if they continue or are bothersome): constipation dark stools dark tongue diarrhea or vomiting This list may not describe all possible side effects. Call your doctor for medical advice about side effects. You may report side effects to FDA at1-800-FDA-1088. Where should I keep my medication? Keep out of the reach of children. Store at room temperature between 15 and 30 degrees C (59 and 86 degrees F). Protect from heat and moisture. Keep tightly closed. Throw away any unusedmedicine after the expiration date. NOTE: This sheet is a summary. It may not cover all possible information. If you have questions about this medicine, talk to your doctor, pharmacist, orhealth care provider.  2022 Elsevier/Gold Standard (2019-05-15 14:40:51) Low-FODMAP Eating Plan  FODMAP stands for fermentable oligosaccharides, disaccharides, monosaccharides, and polyols. These are sugars that are hard for some people to digest. A low-FODMAP eating plan may help some people who have irritable bowel syndrome (IBS) and certain other bowel (intestinal) diseases to manage their symptoms. This meal plan can be complicated to follow. Work with a diet and nutrition specialist (dietitian) to make a low-FODMAP eating plan that is right for you. A dietitian can helpmake sure that you get enough nutrition from this diet. What are tips for following this plan? Reading food labels Check labels for hidden FODMAPs such as: High-fructose syrup. Honey. Agave. Natural fruit flavors. Onion or garlic powder. Choose low-FODMAP foods that contain 3-4 grams of fiber per serving. Check food labels for serving sizes. Eat only one serving at a time to make sure FODMAP levels stay low. Shopping Shop with a  list of foods that are recommended on this diet and make a meal plan. Meal planning Follow a low-FODMAP eating plan for up to 6 weeks, or as told by your health care provider or dietitian. To follow the eating plan: Eliminate high-FODMAP foods from your diet completely. Choose only low-FODMAP foods to eat. You will do this for 2-6 weeks. Gradually reintroduce high-FODMAP foods into your diet one at a time. Most people should wait a few days before introducing the next new high-FODMAP food into their meal plan. Your dietitian can recommend how quickly you may reintroduce foods. Keep a daily record of what and how much you eat and drink. Make note of any symptoms that you have after eating. Review your daily record with a dietitian regularly to identify which foods you can eat and which foods you should avoid. General tips Drink enough fluid each day to keep your urine pale yellow. Avoid processed foods. These often have added sugar and may be high in FODMAPs. Avoid most dairy products, whole grains, and sweeteners. Work with a dietitian to make sure you get enough fiber in your diet. Avoid high FODMAP foods at meals to manage symptoms. Recommended foods Fruits Bananas, oranges, tangerines, lemons, limes, blueberries, raspberries, strawberries, grapes, cantaloupe, honeydew melon, kiwi, papaya, passion fruit, and pineapple. Limited amounts of dried cranberries, banana chips, and shreddedcoconut. Vegetables Eggplant, zucchini, cucumber, peppers, green beans, bean sprouts, lettuce, arugula, kale, Swiss chard, spinach, collard greens, bok choy, summer squash, potato, and tomato. Limited amounts of corn, carrot, and sweet  potato. Greenparts of scallions. Grains Gluten-free grains, such as rice, oats, buckwheat, quinoa, corn, polenta, andmillet. Gluten-free pasta, bread, or cereal. Rice noodles. Corn tortillas. Meats and other proteins Unseasoned beef, pork, poultry, or fish. Eggs. Tomasa Blase. Tofu (firm)  and tempeh. Limited amounts of nuts and seeds, such as almonds, walnuts, [place] nuts,pecans, peanuts, nut butters, pumpkin seeds, chia seeds, and sunflower seeds. Dairy Lactose-free milk, yogurt, and kefir. Lactose-free cottage cheese and ice cream. Non-dairy milks, such as almond, coconut, hemp, and rice milk. Non-dairy yogurt. Limited amounts of goat cheese, brie, mozzarella, parmesan, swiss, andother hard cheeses. Fats and oils Butter-free spreads. Vegetable oils, such as olive, canola, and sunflower oil. Seasoning and other foods Artificial sweeteners with names that do not end in "ol," such as aspartame, saccharine, and stevia. Maple syrup, white table sugar, raw sugar, brown sugar, and molasses. Mayonnaise, soy sauce, and tamari. Fresh basil, coriander,parsley, rosemary, and thyme. Beverages Water and mineral water. Sugar-sweetened soft drinks. Small amounts of orangejuice or cranberry juice. Black and green tea. Most dry wines. Coffee. The items listed above may not be a complete list of foods and beverages you can eat. Contact a dietitian for more information. Foods to avoid Fruits Fresh, dried, and juiced forms of apple, pear, watermelon, peach, plum, cherries, apricots, blackberries, boysenberries, figs, nectarines, and mango.Avocado. Vegetables Chicory root, artichoke, asparagus, cabbage, snow peas, Brussels sprouts, broccoli, sugar snap peas, mushrooms, celery, and cauliflower. Onions, garlic,leeks, and the white part of scallions. Grains Wheat, including kamut, durum, and semolina. Barley and bulgur. Couscous.Wheat-based cereals. Wheat noodles, bread, crackers, and pastries. Meats and other proteins Fried or fatty meat. Sausage. Cashews and pistachios. Soybeans, baked beans, black beans, chickpeas, kidney beans, fava beans, navy beans, lentils,black-eyed peas, and split peas. Dairy Milk, yogurt, ice cream, and soft cheese. Cream and sour cream. Milk-basedsauces. Custard. Buttermilk.  Soy milk. Seasoning and other foods Any sugar-free gum or candy. Foods that contain artificial sweeteners such as sorbitol, mannitol, isomalt, or xylitol. Foods that contain honey, high-fructose corn syrup, or agave. Bouillon, vegetable stock, beef stock, and chicken stock. Garlic and onion powder. Condiments made with onion, such ashummus, chutney, pickles, relish, salad dressing, and salsa. Tomato paste. Beverages Chicory-based drinks. Coffee substitutes. Chamomile tea. Fennel tea. Sweet or fortified wines such as port or sherry. Diet soft drinks made with isomalt, mannitol, maltitol, sorbitol, or xylitol. Apple, pear, and mango juice. Juiceswith high-fructose corn syrup. The items listed above may not be a complete list of foods and beverages you should avoid. Contact a dietitian for more information. Summary FODMAP stands for fermentable oligosaccharides, disaccharides, monosaccharides, and polyols. These are sugars that are hard for some people to digest. A low-FODMAP eating plan is a short-term diet that helps to ease symptoms of certain bowel diseases. The eating plan usually lasts up to 6 weeks. After that, high-FODMAP foods are reintroduced gradually and one at a time. This can help you find out which foods may be causing symptoms. A low-FODMAP eating plan can be complicated. It is best to work with a dietitian who has experience with this type of plan. This information is not intended to replace advice given to you by your health care provider. Make sure you discuss any questions you have with your healthcare provider. Document Revised: 05/22/2019 Document Reviewed: 05/22/2019 Elsevier Patient Education  2022 [employer].

## 2020-08-05 NOTE — Progress Notes (Signed)
Diane Mood MD, MRCP(U.K) [address]  [address]  [place], [place] [address]  Main: [phone]  Fax: [phone]   Gastroenterology Consultation  Referring Provider:     Eustaquio Boyden, MD Primary Care Physician:  Diane Boyden, MD Primary Gastroenterologist:  Dr. Wyline Hood  Reason for Consultation:     Epigastric and rectal pain         HPI:   Diane Hood is a 50 y.o. y/o female referred for consultation & management  by Dr. Eustaquio Boyden, MD.    She has been referred for epigastric pain and rectal pain.  Previously been seen at a gastroenterology practice in [place].  As per last office note she has had excess bloating and abdominal pain in 2020 colonoscopy in 2018 was normal and repeat was recommended in 10 years at that point of time she has had a CT scan of the abdomen which showed no gross abnormality.  H. pylori stool antigen was negative in June 2022, in May 2022 hemoglobin of 13.4 g  She says that for the past few months she has had issue with bloating usually an hour after meals or 2.  Leads to abdominal distention and gives her the feeling that she is pregnant.  A lot of gurgling sounds.  Denies any change in bowel habits.  Denies any constipation.  Has regular bowel movements daily.  Denies any artificial sugars or sweeteners in her diet.   Past Medical History:  Diagnosis Date   Arthritis 01/2011   hospitalization for this - saw rheum - treated with prednisone x 10 months   Chronic kidney disease    Depression    Polycystic kidney disease    Renal insufficiency    Thyroid nodule 2019   focal increased uptake L lower lobe     Past Surgical History:  Procedure Laterality Date   ABDOMINAL HYSTERECTOMY  2013   still have ovaries   TONSILLECTOMY  1994    Prior to Admission medications   Medication Sig Start Date End Date Taking? Authorizing Provider  cetirizine (ZYRTEC) 10 MG tablet Take 1 tablet (10 mg total) by mouth daily. Patient  taking differently: Take 10 mg by mouth daily. As needed 04/15/20   Diane Boyden, MD  Cholecalciferol 125 MCG (5000 UT) TABS 1 Tablet(s) P.O. daily 04/05/17   [provider]  estradiol (ESTRACE) 1 MG tablet Take 1 tablet (1 mg total) by mouth daily. Patient taking differently: Take 1 mg by mouth daily. As needed 09/12/19   Emi Belfast, FNP  fluticasone (FLONASE) 50 MCG/ACT nasal spray Place 2 sprays into both nostrils daily. Patient taking differently: Place 2 sprays into both nostrils daily. As needed 04/15/20   Diane Boyden, MD  hydrocortisone (ANUSOL-HC) 25 MG suppository Place 1 suppository (25 mg total) rectally 2 (two) times daily as needed for hemorrhoids. 06/16/20   Diane Boyden, MD  omeprazole (PRILOSEC) 20 MG capsule Take 1 capsule (20 mg total) by mouth daily as needed (heartburn). 04/13/20   Diane Boyden, MD  sertraline (ZOLOFT) 50 MG tablet Take 1.5 tablets (75 mg total) by mouth daily. 07/29/20   Diane Boyden, MD  valACYclovir (VALTREX) 500 MG tablet TAKE 2 TABLETS BY MOUTH AS NEEDED 12/04/19   Emi Belfast, FNP    Family History  Problem Relation Age of Onset   Heart attack Father 33       MI   Diabetes Father    Hyperlipidemia Father    Diverticulitis Father        ?  s/p intestinal surgery   Cancer Neg Hx    Stroke Neg Hx      Social History   Tobacco Use   Smoking status: Never   Smokeless tobacco: Never  Substance Use Topics   Alcohol use: Never   Drug use: Never    Allergies as of 08/05/2020   (No Known Allergies)    Review of Systems:    All systems reviewed and negative except where noted in HPI.   Physical Exam:  BP 119/70   Pulse 61   Temp 98.2 F (36.8 C) (Oral)   Ht 5' 4.5" (1.638 m)   Wt 130 lb (59 kg)   BMI 21.97 kg/m  No LMP recorded. Patient has had a hysterectomy. Psych:  Alert and cooperative. Normal Hood and affect. General:   Alert,  Well-developed, well-nourished, pleasant and cooperative in  NAD Head:  Normocephalic and atraumatic. Eyes:  Sclera clear, no icterus.   Conjunctiva pink. Ears:  Normal auditory acuity. Lungs:  Respirations even and unlabored.  Clear throughout to auscultation.   No wheezes, crackles, or rhonchi. No acute distress. Heart:  Regular rate and rhythm; no murmurs, clicks, rubs, or gallops. Abdomen:  Normal bowel sounds.  No bruits.  Soft, non-tender and non-distended without masses, hepatosplenomegaly or hernias noted.  No guarding or rebound tenderness.    Neurologic:  Alert and oriented x3;  grossly normal neurologically. Psych:  Alert and cooperative. Normal Hood and affect.  Imaging Studies: No results found.  Assessment and Plan:   Anis Cinelli is a 50 y.o. y/o female has been referred for abdominal bloating.  Her history is very suggestive of possible small bowel bacterial overgrowth syndrome.  Give her options of lifestyle changes versus antibiotic treatment.  I will try a week or 2 weeks of a low FODMAP diet, avoiding all artificial sugars and sweeteners, use of charcoal tablets as needed.  If it does not work she has been advised to contact me and I will treat her with antibiotics after that.  We will check her for celiac disease  Follow up in as needed  Dr Diane Mood MD,MRCP(U.K)

## 2020-08-11 LAB — CELIAC DISEASE PANEL
Endomysial IgA: NEGATIVE
IgA/Immunoglobulin A, Serum: 99 mg/dL (ref 87–352)
Transglutaminase IgA: <2 U/mL (ref 0–3)

## 2020-09-02 ENCOUNTER — Telehealth (INDEPENDENT_AMBULATORY_CARE_PROVIDER_SITE_OTHER): Payer: No Typology Code available for payment source | Admitting: Gastroenterology

## 2020-09-02 DIAGNOSIS — K580 Irritable bowel syndrome with diarrhea: Secondary | ICD-10-CM

## 2020-09-02 NOTE — Progress Notes (Signed)
[name] , MD [address]  [address]  [place], [place] [address]  Main: [phone]  Fax: [phone]   Primary Care Physician: Eustaquio Boyden, MD  Virtual Visit via Video Note  I connected with patient on 09/02/20 at  2:45 PM EDT by video and verified that I am speaking with the correct person using two identifiers.   I discussed the limitations, risks, security and privacy concerns of performing an evaluation and management service by video  and the availability of in person appointments. I also discussed with the patient that there may be a patient responsible charge related to this service. The patient expressed understanding and agreed to proceed.  Location of Patient: Home Location of Provider: Home Persons involved: Patient and provider only   History of Present Illness:  Follow-up for abdominal bloating    HPI: Diane Hood is a 50 y.o. female  Summary of history :  Initially referred and seen on 08/05/2020 for epigastric and rectal pain.Previously been seen at a gastroenterology practice in [place].  As per last office note she has had excess bloating and abdominal pain in 2020 colonoscopy in 2018 was normal and repeat was recommended in 10 years at that point of time she has had a CT scan of the abdomen which showed no gross abnormality.   H. pylori stool antigen was negative in June 2022, in May 2022 hemoglobin of 13.4 g   She says that for the past few months she has had issue with bloating usually an hour after meals or 2.  Leads to abdominal distention and gives her the feeling that she is pregnant.  A lot of gurgling sounds.  Denies any change in bowel habits.  Denies any constipation.  Has regular bowel movements daily.  Denies any artificial sugars or sweeteners in her diet.  Interval history 08/05/2020-09/02/2020  No better after a trial of low FODMAP diet and avoiding all artificial sugars and sweeteners.  Continues to have diarrhea as well as  a lot of bloating and abdominal distention with gas.      Current Outpatient Medications  Medication Sig Dispense Refill   cetirizine (ZYRTEC) 10 MG tablet Take 1 tablet (10 mg total) by mouth daily. (Patient not taking: Reported on 08/05/2020)     Cholecalciferol 125 MCG (5000 UT) TABS 1 Tablet(s) P.O. daily     estradiol (ESTRACE) 1 MG tablet Take 1 tablet (1 mg total) by mouth daily. (Patient not taking: Reported on 08/05/2020) 90 tablet 3   fluticasone (FLONASE) 50 MCG/ACT nasal spray Place 2 sprays into both nostrils daily. (Patient not taking: Reported on 08/05/2020)     hydrocortisone (ANUSOL-HC) 25 MG suppository Place 1 suppository (25 mg total) rectally 2 (two) times daily as needed for hemorrhoids. (Patient not taking: Reported on 08/05/2020) 12 suppository 0   sertraline (ZOLOFT) 50 MG tablet Take 1.5 tablets (75 mg total) by mouth daily. 135 tablet 3   valACYclovir (VALTREX) 500 MG tablet TAKE 2 TABLETS BY MOUTH AS NEEDED (Patient not taking: Reported on 08/05/2020) 20 tablet 1   No current facility-administered medications for this visit.    Allergies as of 09/02/2020   (No Known Allergies)    Review of Systems:    All systems reviewed and negative except where noted in HPI.  General Appearance:    Alert, cooperative, no distress, appears stated age  Head:    Normocephalic, without obvious abnormality, atraumatic  Eyes:    PERRL, conjunctiva/corneas clear,  Ears:  Grossly normal hearing    Neurologic:  Grossly normal    Observations/Objective:  Labs: CMP     Component Value Date/Time   NA 142 05/26/2020 1601   K 3.7 05/26/2020 1601   CL 106 05/26/2020 1601   CO2 29 05/26/2020 1601   GLUCOSE 86 05/26/2020 1601   BUN 21 05/26/2020 1601   CREATININE 1.00 05/26/2020 1831   CALCIUM 9.3 05/26/2020 1601   PROT 7.3 05/26/2020 1601   ALBUMIN 4.4 05/26/2020 1601   AST 19 05/26/2020 1601   ALT 15 05/26/2020 1601   ALKPHOS 69 05/26/2020 1601   BILITOT 0.3 05/26/2020  1601   Lab Results  Component Value Date   WBC 9.5 05/26/2020   HGB 13.4 05/26/2020   HCT 39.5 05/26/2020   MCV 87.7 05/26/2020   PLT 368.0 05/26/2020    Imaging Studies: No results found.  Assessment and Plan:   Diane Hood is a 50 y.o. y/o female here to follow-up for abdominal bloating.  Her history is very suggestive of possible IBS-D.  Did not respond to low fodmap diet and stopping alkl artificial sugars  Plan 1.  Trial of Xifaxan 550 mg 3 times daily for 14 days if no better options would include Creon and evaluating the colon for microscopic colitis.  Follow-up in 3 weeks   I discussed the assessment and treatment plan with the patient. The patient was provided an opportunity to ask questions and all were answered. The patient agreed with the plan and demonstrated an understanding of the instructions.   The patient was advised to call back or seek an in-person evaluation if the symptoms worsen or if the condition fails to improve as anticipated.  I provided 15 minutes of face-to-face time during this encounter.  Dr [name] MD,MRCP [facility]) Gastroenterology/Hepatology Pager: [phone]   Speech recognition software was used to dictate this note.

## 2020-09-06 ENCOUNTER — Other Ambulatory Visit: Payer: Self-pay

## 2020-09-06 MED ORDER — RIFAXIMIN 550 MG PO TABS: 550.0000 mg | ORAL_TABLET | Freq: Three times a day (TID) | ORAL | 0 refills | Status: DC

## 2020-09-09 ENCOUNTER — Telehealth: Payer: Self-pay | Admitting: Gastroenterology

## 2020-09-09 ENCOUNTER — Other Ambulatory Visit: Payer: Self-pay

## 2020-09-09 MED ORDER — RIFAXIMIN 550 MG PO TABS: 550.0000 mg | ORAL_TABLET | Freq: Three times a day (TID) | ORAL | 0 refills | Status: AC

## 2020-09-09 NOTE — Telephone Encounter (Signed)
Patient walked into the office wanting to speak with CMA about changing the rifaximin (XIFAXAN) 550 MG TABS tablet  medication. It's to expensive.Clinical staff will follow up with patient.

## 2020-09-09 NOTE — Telephone Encounter (Signed)
Patient was given samples of Xifaxan 550 MG a day for 8 days and for the following I sent her a new prescription with a discount card.

## 2020-09-12 ENCOUNTER — Other Ambulatory Visit: Payer: Self-pay | Admitting: Family Medicine

## 2020-09-12 DIAGNOSIS — R7989 Other specified abnormal findings of blood chemistry: Secondary | ICD-10-CM

## 2020-09-12 DIAGNOSIS — E0590 Thyrotoxicosis, unspecified without thyrotoxic crisis or storm: Secondary | ICD-10-CM

## 2020-09-13 ENCOUNTER — Other Ambulatory Visit: Payer: PRIVATE HEALTH INSURANCE

## 2020-09-15 ENCOUNTER — Telehealth: Payer: Self-pay | Admitting: Family Medicine

## 2020-09-23 ENCOUNTER — Ambulatory Visit: Payer: No Typology Code available for payment source | Admitting: Gastroenterology

## 2020-09-23 ENCOUNTER — Telehealth: Payer: Self-pay | Admitting: Family Medicine

## 2020-09-23 ENCOUNTER — Other Ambulatory Visit: Payer: Self-pay

## 2020-09-23 ENCOUNTER — Encounter: Payer: Self-pay | Admitting: Gastroenterology

## 2020-09-23 VITALS — BP 97/53 | HR 58 | Temp 98.4°F | Ht 65.0 in | Wt 124.0 lb

## 2020-09-23 DIAGNOSIS — R232 Flushing: Secondary | ICD-10-CM

## 2020-09-23 DIAGNOSIS — R140 Abdominal distension (gaseous): Secondary | ICD-10-CM | POA: Diagnosis not present

## 2020-09-23 DIAGNOSIS — K580 Irritable bowel syndrome with diarrhea: Secondary | ICD-10-CM

## 2020-09-23 MED ORDER — METOCLOPRAMIDE HCL 10 MG PO TABS: 10.0000 mg | ORAL_TABLET | Freq: Three times a day (TID) | ORAL | 0 refills | Status: DC | PRN

## 2020-09-23 NOTE — Patient Instructions (Signed)
Gastroparesis  Gastroparesis is a condition in which food takes longer than normal to empty from the stomach. This condition is also known as delayed gastric emptying. It is usually a long-term (chronic) condition. There is no cure, but there are treatments and things that you can do at home to help relieve symptoms. Treating the underlying condition that causesgastroparesis can also help relieve symptoms. What are the causes? In many cases, the cause of this condition is not known. Possible causes include: A hormone (endocrine) disorder, such as hypothyroidism or diabetes. A nervous system disease, such as Parkinson's disease or multiple sclerosis. Cancer, infection, or surgery that affects the stomach or vagus nerve. The vagus nerve runs from your chest, through your neck, and to the lower part of your brain. A connective tissue disorder, such as scleroderma. Certain medicines. What increases the risk? You are more likely to develop this condition if: You have certain disorders or diseases. These may include: An endocrine disorder. An eating disorder. Amyloidosis. Scleroderma. Parkinson's disease. Multiple sclerosis. Cancer or infection of the stomach or the vagus nerve. You have had surgery on your stomach or vagus nerve. You take certain medicines. You are female. What are the signs or symptoms? Symptoms of this condition include: Feeling full after eating very little or a loss of appetite. Nausea, vomiting, or heartburn. Bloating of your abdomen. Inconsistent blood sugar (glucose) levels on blood tests. Unexplained weight loss. Acid from the stomach coming up into the esophagus (gastroesophageal reflux). Sudden tightening (spasm) of the stomach, which can be painful. Symptoms may come and go. Some people may not notice any symptoms. How is this diagnosed? This condition is diagnosed with tests, such as: Tests that check how long it takes food to move through the stomach and  intestines. These tests include: Upper gastrointestinal (GI) series. For this test, you drink a liquid that shows up well on X-rays, and then X-rays are taken of your intestines. Gastric emptying scintigraphy. For this test, you eat food that contains a small amount of radioactive material, and then scans are taken. Wireless capsule GI monitoring system. For this test, you swallow a pill (capsule) that records information about how foods and fluid move through your stomach. Gastric manometry. For this test, a tube is passed down your throat and into your stomach to measure electrical and muscular activity. Endoscopy. For this test, a long, thin tube with a camera and light on the end is passed down your throat and into your stomach to check for problems in your stomach lining. Ultrasound. This test uses sound waves to create images of the inside of your body. This can help rule out gallbladder disease or pancreatitis as a cause of your symptoms. How is this treated? There is no cure for this condition, but treatment and home care may relieve symptoms. Treatment may include: Treating the underlying cause. Managing your symptoms by making changes to your diet and exercise habits. Taking medicines to control nausea and vomiting and to stimulate stomach muscles. Getting food through a feeding tube in the hospital. This may be done in severe cases. Having surgery to insert a device called a gastric electrical stimulator into your body. This device helps improve stomach emptying and control nausea and vomiting. Follow these instructions at home: Take over-the-counter and prescription medicines only as told by your health care provider. Follow instructions from your health care provider about eating or drinking restrictions. Your health care provider may recommend that you: Eat smaller meals more often. Eat low-fat   foods. Eat low-fiber forms of high-fiber foods. For example, eat cooked vegetables instead  of raw vegetables. Have only liquid foods instead of solid foods. Liquid foods are easier to digest. Drink enough fluid to keep your urine pale yellow. Exercise as often as told by your health care provider. Keep all follow-up visits. This is important. Contact a health care provider if you: Notice that your symptoms do not improve with treatment. Have new symptoms. Get help right away if you: Have severe pain in your abdomen that does not improve with treatment. Have nausea that is severe or does not go away. Vomit every time you drink fluids. Summary Gastroparesis is a long-term (chronic) condition in which food takes longer than normal to empty from the stomach. Symptoms include nausea, vomiting, heartburn, bloating of your abdomen, and loss of appetite. Eating smaller portions, low-fat foods, and low-fiber forms of high-fiber foods may help you manage your symptoms. Get help right away if you have severe pain in your abdomen. This information is not intended to replace advice given to you by your health care provider. Make sure you discuss any questions you have with your healthcare provider. Document Revised: 05/12/2019 Document Reviewed: 05/12/2019 Elsevier Patient Education  2022 Elsevier Inc.  

## 2020-09-23 NOTE — Progress Notes (Signed)
[name] MD, MRCP(U.K) [address]  [address]  [place], [place] [address]  Main: [phone]  Fax: [phone]   Primary Care Physician: Eustaquio Boyden, MD  Primary Gastroenterologist:  Dr. [name]   Chief complaint follow-up for IBS diarrhea  HPI: Diane Hood is a 50 y.o. female  Summary of history :   Initially referred and seen on 08/05/2020 for epigastric and rectal pain.Previously been seen at a gastroenterology practice in [place].  As per last office note she has had excess bloating and abdominal pain in 2020 colonoscopy in 2018 was normal and repeat was recommended in 10 years at that point of time she has had a CT scan of the abdomen which showed no gross abnormality.   H. pylori stool antigen was negative in June 2022, in May 2022 hemoglobin of 13.4 g   She says that for the past few months she has had issue with bloating usually an hour after meals or 2.  Leads to abdominal distention and gives her the feeling that she is pregnant.  A lot of gurgling sounds.  Denies any change in bowel habits.  Denies any constipation.  Has regular bowel movements daily.  Denies any artificial sugars or sweeteners in her diet.   Interval history 09/02/2020-09/23/2020  Given a course of 14 days of Xifaxan at last visit.  Did not improve with a trial of low FODMAP diet as well as avoiding all artificial sugars and sweeteners.  Continues to have at last visit diarrhea as well as a lot of bloating and abdominal distention.     Since the last visit and commencing on Xifaxan she has had complete resolution of diarrhea but still has some bloating and gaseous distention with a sensation of food sitting in her stomach for a long period of time.  She is not consuming any artificial sugars or sweeteners.  Current Outpatient Medications  Medication Sig Dispense Refill   cetirizine (ZYRTEC) 10 MG tablet Take 1 tablet (10 mg total) by mouth daily.     Cholecalciferol 125 MCG (5000  UT) TABS 1 Tablet(s) P.O. daily     estradiol (ESTRACE) 1 MG tablet Take 1 tablet (1 mg total) by mouth daily. 90 tablet 3   fluticasone (FLONASE) 50 MCG/ACT nasal spray Place 2 sprays into both nostrils daily.     hydrocortisone (ANUSOL-HC) 25 MG suppository Place 1 suppository (25 mg total) rectally 2 (two) times daily as needed for hemorrhoids. 12 suppository 0   sertraline (ZOLOFT) 50 MG tablet Take 1.5 tablets (75 mg total) by mouth daily. 135 tablet 3   valACYclovir (VALTREX) 500 MG tablet TAKE 2 TABLETS BY MOUTH AS NEEDED 20 tablet 1   No current facility-administered medications for this visit.    Allergies as of 09/23/2020   (Not on File)    ROS:  General: Negative for anorexia, weight loss, fever, chills, fatigue, weakness. ENT: Negative for hoarseness, difficulty swallowing , nasal congestion. CV: Negative for chest pain, angina, palpitations, dyspnea on exertion, peripheral edema.  Respiratory: Negative for dyspnea at rest, dyspnea on exertion, cough, sputum, wheezing.  GI: See history of present illness. GU:  Negative for dysuria, hematuria, urinary incontinence, urinary frequency, nocturnal urination.  Endo: Negative for unusual weight change.    Physical Examination:   BP (!) 97/53 (BP Location: Left Arm, Patient Position: Sitting, Cuff Size: Normal)   Pulse (!) 58   Temp 98.4 F (36.9 C) (Oral)   Ht 5\' 5"  (1.651 m)  Wt 124 lb (56.2 kg)   BMI 20.63 kg/m   General: Well-nourished, well-developed in no acute distress.  Eyes: No icterus. Conjunctivae pink. Neuro: Alert and oriented x 3.  Grossly intact. Skin: Warm and dry, no jaundice.   Psych: Alert and cooperative, normal mood and affect.   Imaging Studies: No results found.  Assessment and Plan:   Diane Hood is a 50 y.o. y/o female  here to follow-up for IBS-D with diarrhea completely resolved after a course of Xifaxan.  Present symptoms are more of bloating distention and sometimes a sensation of  food sitting in the stomach for long periods of time.  Differentials include bacterial overgrowth syndrome versus gastroparesis.   Plan 1.  Trial of a gastroparesis diet and Reglan as needed.  If no better at next visit we will consider endoscopy, sucrase testing and bacterial overgrowth testing.She has been advised to contact my office in 10 days time to inform me if things are better or not.  I have informed her that Reglan should not be used long-term and can be used for short-term process to at least narrow down her differential diagnosis.  Dr [name]  MD,MRCP [facility]) Follow up in as needed

## 2020-09-30 NOTE — Telephone Encounter (Signed)
Rx sent on 07/29/20, #135/3 to local pharmacy, [facility], [place].

## 2020-10-25 ENCOUNTER — Other Ambulatory Visit: Payer: Self-pay

## 2020-10-25 DIAGNOSIS — R1084 Generalized abdominal pain: Secondary | ICD-10-CM

## 2020-10-25 DIAGNOSIS — R140 Abdominal distension (gaseous): Secondary | ICD-10-CM

## 2020-10-28 ENCOUNTER — Encounter: Payer: Self-pay | Admitting: Gastroenterology

## 2020-10-29 ENCOUNTER — Ambulatory Visit: Payer: PRIVATE HEALTH INSURANCE | Admitting: Certified Registered Nurse Anesthetist

## 2020-10-29 ENCOUNTER — Encounter
Admission: RE | Disposition: A | Discharge status: Home / Self Care | Payer: Self-pay | Source: Home / Self Care | Attending: Gastroenterology

## 2020-10-29 ENCOUNTER — Ambulatory Visit
Admission: RE | Admit: 2020-10-29 | Discharge: 2020-10-29 | Disposition: A | Discharge status: Home / Self Care | Payer: PRIVATE HEALTH INSURANCE | Attending: Gastroenterology | Admitting: Gastroenterology

## 2020-10-29 DIAGNOSIS — K219 Gastro-esophageal reflux disease without esophagitis: Secondary | ICD-10-CM | POA: Diagnosis not present

## 2020-10-29 DIAGNOSIS — Q613 Polycystic kidney, unspecified: Secondary | ICD-10-CM | POA: Diagnosis not present

## 2020-10-29 DIAGNOSIS — R1084 Generalized abdominal pain: Secondary | ICD-10-CM

## 2020-10-29 DIAGNOSIS — R140 Abdominal distension (gaseous): Secondary | ICD-10-CM | POA: Diagnosis not present

## 2020-10-29 DIAGNOSIS — E0590 Thyrotoxicosis, unspecified without thyrotoxic crisis or storm: Secondary | ICD-10-CM | POA: Diagnosis not present

## 2020-10-29 DIAGNOSIS — Z79899 Other long term (current) drug therapy: Secondary | ICD-10-CM | POA: Diagnosis not present

## 2020-10-29 DIAGNOSIS — N189 Chronic kidney disease, unspecified: Secondary | ICD-10-CM | POA: Insufficient documentation

## 2020-10-29 HISTORY — PX: ESOPHAGOGASTRODUODENOSCOPY (EGD) WITH PROPOFOL: SHX5813

## 2020-10-29 SURGERY — ESOPHAGOGASTRODUODENOSCOPY (EGD) WITH PROPOFOL
Anesthesia: General

## 2020-10-29 MED ORDER — SODIUM CHLORIDE 0.9 % IV SOLN
INTRAVENOUS | Status: DC
  Administered 2020-10-29: 20 mL/h via INTRAVENOUS

## 2020-10-29 MED ORDER — LIDOCAINE HCL (CARDIAC) PF 100 MG/5ML IV SOSY
PREFILLED_SYRINGE | INTRAVENOUS | Status: DC | PRN
  Administered 2020-10-29: 100 mg via INTRAVENOUS

## 2020-10-29 MED ORDER — PROPOFOL 10 MG/ML IV BOLUS
INTRAVENOUS | Status: DC | PRN
  Administered 2020-10-29: 70 mg via INTRAVENOUS
  Administered 2020-10-29: 30 mg via INTRAVENOUS

## 2020-10-29 MED ORDER — GLYCOPYRROLATE 0.2 MG/ML IJ SOLN
INTRAMUSCULAR | Status: DC | PRN
  Administered 2020-10-29: .2 mg via INTRAVENOUS

## 2020-10-29 MED ORDER — PROPOFOL 500 MG/50ML IV EMUL
INTRAVENOUS | Status: DC | PRN
  Administered 2020-10-29: 150 ug/kg/min via INTRAVENOUS

## 2020-10-29 NOTE — Anesthesia Postprocedure Evaluation (Signed)
Anesthesia Post Note  Patient: Diane Hood  Procedure(s) Performed: ESOPHAGOGASTRODUODENOSCOPY (EGD) WITH PROPOFOL  Patient location during evaluation: PACU Anesthesia Type: General Level of consciousness: awake and alert, oriented and patient cooperative Pain management: pain level controlled Vital Signs Assessment: post-procedure vital signs reviewed and stable Respiratory status: spontaneous breathing, nonlabored ventilation and respiratory function stable Cardiovascular status: blood pressure returned to baseline and stable Postop Assessment: adequate PO intake Anesthetic complications: no   No notable events documented.   Last Vitals:  Vitals:   10/29/20 1147 10/29/20 1207  BP: 115/73 125/84  Pulse: 69   Resp: 20   Temp: (!) 36.2 C   SpO2: 99%     Last Pain:  Vitals:   10/29/20 1207  TempSrc:   PainSc: 0-No pain                 Reed Breech

## 2020-10-29 NOTE — H&P (Signed)
[name], MD [address], [address], [place], [place], [address] [address], [address], [place], [place], [address] Phone: [phone]  Fax: [phone]  Primary Care Physician:  Eustaquio Boyden, MD   Pre-Procedure History & Physical: HPI:  Diane Hood is a 50 y.o. female is here for an endoscopy    Past Medical History:  Diagnosis Date   Arthritis 01/2011   hospitalization for this - saw rheum - treated with prednisone x 10 months   Chronic kidney disease    Depression    Polycystic kidney disease    Renal insufficiency    Thyroid nodule 2019   focal increased uptake L lower lobe     Past Surgical History:  Procedure Laterality Date   ABDOMINAL HYSTERECTOMY  2013   still have ovaries   TONSILLECTOMY  1994    Prior to Admission medications   Medication Sig Start Date End Date Taking? Authorizing Provider  cetirizine (ZYRTEC) 10 MG tablet Take 1 tablet (10 mg total) by mouth daily. 04/15/20  Yes Eustaquio Boyden, MD  Cholecalciferol 125 MCG (5000 UT) TABS 1 Tablet(s) P.O. daily 04/05/17  Yes [provider]  estradiol (ESTRACE) 1 MG tablet Take 1 tablet (1 mg total) by mouth daily. 09/12/19  Yes Emi Belfast, FNP  fluticasone (FLONASE) 50 MCG/ACT nasal spray Place 2 sprays into both nostrils daily. 04/15/20  Yes Eustaquio Boyden, MD  hydrocortisone (ANUSOL-HC) 25 MG suppository Place 1 suppository (25 mg total) rectally 2 (two) times daily as needed for hemorrhoids. 06/16/20  Yes Eustaquio Boyden, MD  metoCLOPramide (REGLAN) 10 MG tablet Take 1 tablet (10 mg total) by mouth 3 (three) times daily as needed for nausea. 09/23/20  Yes [name], MD  sertraline (ZOLOFT) 50 MG tablet Take 1.5 tablets (75 mg total) by mouth daily. 07/29/20  Yes Eustaquio Boyden, MD  valACYclovir (VALTREX) 500 MG tablet TAKE 2 TABLETS BY MOUTH AS NEEDED 12/04/19  Yes Emi Belfast, FNP    Allergies as of 10/25/2020   (Not on File)    Family History  Problem  Relation Age of Onset   Heart attack Father 82       MI   Diabetes Father    Hyperlipidemia Father    Diverticulitis Father        ? s/p intestinal surgery   Cancer Neg Hx    Stroke Neg Hx     Social History   Socioeconomic History   Marital status: Married    Spouse name: Not on file   Number of children: Not on file   Years of education: Not on file   Highest education level: Not on file  Occupational History   Not on file  Tobacco Use   Smoking status: Never   Smokeless tobacco: Never  Vaping Use   Vaping Use: Never used  Substance and Sexual Activity   Alcohol use: Never   Drug use: Never   Sexual activity: Yes  Other Topics Concern   Not on file  Social History Narrative   From [place]   Moved to the area 12/2018 from [place] ([place]) prior lived in [place]   Lives with husband (from [place]) 2 children    Occ: housewife    Activity: yoga, walking   Diet: good water, fruits/vegetables daily    Social Determinants of Health   Financial Resource Strain: Not on file  Food Insecurity: Not on file  Transportation Needs: Not on file  Physical Activity: Not on  file  Stress: Not on file  Social Connections: Not on file  Intimate Partner Violence: Not on file    Review of Systems: See HPI, otherwise negative ROS  Physical Exam: BP 112/66   Pulse 61   Temp (!) 97.2 F (36.2 C) (Temporal)   Resp 20   Ht 5\' 6"  (1.676 m)   Wt 55.8 kg   SpO2 100%   BMI 19.85 kg/m  General:   Alert,  pleasant and cooperative in NAD Head:  Normocephalic and atraumatic. Neck:  Supple; no masses or thyromegaly. Lungs:  Clear throughout to auscultation, normal respiratory effort.    Heart:  +S1, +S2, Regular rate and rhythm, No edema. Abdomen:  Soft, nontender and nondistended. Normal bowel sounds, without guarding, and without rebound.   Neurologic:  Alert and  oriented x4;  grossly normal neurologically.  Impression/Plan: [name] is here for an endoscopy  to be  performed for  evaluation of bloating    Risks, benefits, limitations, and alternatives regarding endoscopy have been reviewed with the patient.  Questions have been answered.  All parties agreeable.   Geryl Councilman, MD  10/29/2020, 11:04 AM

## 2020-10-29 NOTE — Op Note (Signed)
[facility] Gastroenterology Patient Name: Diane Hood Procedure Date: 10/29/2020 11:31 AM MRN: [id number] Account #: [id number] Date of Birth: 1970-09-04 Admit Type: Outpatient Age: 50 Room: [facility] ENDO ROOM 3 Gender: Female Note Status: Finalized Instrument Name: Upper Endoscope [id number] Procedure:             Upper GI endoscopy Indications:           Abdominal bloating Providers:             [name] MD, MD Medicines:             Monitored Anesthesia Care Complications:         No immediate complications. Procedure:             Pre-Anesthesia Assessment:                        - Prior to the procedure, a History and Physical was                         performed, and patient medications, allergies and                         sensitivities were reviewed. The patient's tolerance                         of previous anesthesia was reviewed.                        - The risks and benefits of the procedure and the                         sedation options and risks were discussed with the                         patient. All questions were answered and informed                         consent was obtained.                        - ASA Grade Assessment: II - A patient with mild                         systemic disease.                        After obtaining informed consent, the endoscope was                         passed under direct vision. Throughout the procedure,                         the patient's blood pressure, pulse, and oxygen                         saturations were monitored continuously. The Endoscope                         was introduced through the mouth, and advanced to the  third part of duodenum. The upper GI endoscopy was                         accomplished with ease. The patient tolerated the                         procedure well. Findings:      The esophagus was normal.      The stomach was normal.      The  examined duodenum was normal. Impression:            - Normal esophagus.                        - Normal stomach.                        - Normal examined duodenum.                        - No specimens collected. Recommendation:        - Discharge patient to home (with escort).                        - Resume previous diet.                        - Continue present medications.                        - Return to my office in 3 weeks.                        - Pick up samples of xifaxan as instructed to patient                         on Monday to empirically treat for SIBO Procedure Code(s):     --- Professional ---                        [phone], Esophagogastroduodenoscopy, flexible,                         transoral; diagnostic, including collection of                         specimen(s) by brushing or washing, when performed                         (separate procedure) Diagnosis Code(s):     --- Professional ---                        R14.0, Abdominal distension (gaseous) CPT copyright 2019 American Medical Association. All rights reserved. The codes documented in this report are preliminary and upon coder review may  be revised to meet current compliance requirements. [name], MD [name] MD, MD 10/29/2020 11:44:05 AM This report has been signed electronically. Number of Addenda: 0 Note Initiated On: 10/29/2020 11:31 AM Estimated Blood Loss:  Estimated blood loss: none.      [facility]

## 2020-10-29 NOTE — Transfer of Care (Signed)
Immediate Anesthesia Transfer of Care Note  Patient: Diane Hood  Procedure(s) Performed: ESOPHAGOGASTRODUODENOSCOPY (EGD) WITH PROPOFOL  Patient Location: Endoscopy Unit  Anesthesia Type:General  Level of Consciousness: drowsy  Airway & Oxygen Therapy: Patient Spontanous Breathing  Post-op Assessment: Report given to RN and Post -op Vital signs reviewed and stable  Post vital signs: Reviewed and stable  Last Vitals:  Vitals Value Taken Time  BP 115/73 10/29/20 1147  Temp 36.2 C 10/29/20 1147  Pulse 70 10/29/20 1148  Resp 21 10/29/20 1148  SpO2 98 % 10/29/20 1148  Vitals shown include unvalidated device data.  Last Pain:  Vitals:   10/29/20 1147  TempSrc: Temporal  PainSc: Asleep         Complications: No notable events documented.

## 2020-10-29 NOTE — Anesthesia Preprocedure Evaluation (Signed)
Anesthesia Evaluation  Patient identified by MRN, date of birth, ID band Patient awake    Reviewed: Allergy & Precautions, NPO status , Patient's Chart, lab work & pertinent test results  History of Anesthesia Complications Negative for: history of anesthetic complications  Airway Mallampati: I   Neck ROM: Full    Dental no notable dental hx.    Pulmonary neg pulmonary ROS,    Pulmonary exam normal breath sounds clear to auscultation       Cardiovascular Exercise Tolerance: Good negative cardio ROS Normal cardiovascular exam Rhythm:Regular Rate:Normal     Neuro/Psych PSYCHIATRIC DISORDERS Depression negative neurological ROS     GI/Hepatic GERD  ,  Endo/Other  Hyperthyroidism   Renal/GU Renal disease (polycystic kidney disease)     Musculoskeletal  (+) Arthritis ,   Abdominal   Peds  Hematology negative hematology ROS (+)   Anesthesia Other Findings   Reproductive/Obstetrics                             Anesthesia Physical Anesthesia Plan  ASA: 2  Anesthesia Plan: General   Post-op Pain Management:    Induction: Intravenous  PONV Risk Score and Plan: 3 and Propofol infusion, TIVA and Treatment may vary due to age or medical condition  Airway Management Planned: Natural Airway  Additional Equipment:   Intra-op Plan:   Post-operative Plan:   Informed Consent: I have reviewed the patients History and Physical, chart, labs and discussed the procedure including the risks, benefits and alternatives for the proposed anesthesia with the patient or authorized representative who has indicated his/her understanding and acceptance.       Plan Discussed with: CRNA  Anesthesia Plan Comments:         Anesthesia Quick Evaluation

## 2020-10-29 NOTE — Anesthesia Procedure Notes (Signed)
Date/Time: 10/29/2020 11:48 AM Performed by: Joanette Gula, Dalten Ambrosino, CRNA Pre-anesthesia Checklist: Patient identified, Emergency Drugs available, Suction available, Patient being monitored and Timeout performed Patient Re-evaluated:Patient Re-evaluated prior to induction Oxygen Delivery Method: Simple face mask Induction Type: IV induction

## 2020-11-01 ENCOUNTER — Encounter: Payer: Self-pay | Admitting: Gastroenterology

## 2020-11-16 ENCOUNTER — Encounter: Payer: Self-pay | Admitting: Family Medicine

## 2020-11-16 DIAGNOSIS — R232 Flushing: Secondary | ICD-10-CM

## 2020-11-16 MED ORDER — ESTRADIOL 1 MG PO TABS: 1.0000 mg | ORAL_TABLET | Freq: Every day | ORAL | 3 refills | Status: DC

## 2020-11-22 ENCOUNTER — Other Ambulatory Visit: Payer: Self-pay

## 2020-11-22 MED ORDER — PANCRELIPASE (LIP-PROT-AMYL) 24000-76000 UNITS PO CPEP: ORAL_CAPSULE | ORAL | 0 refills | Status: DC

## 2020-11-23 ENCOUNTER — Other Ambulatory Visit: Payer: Self-pay

## 2020-11-23 ENCOUNTER — Other Ambulatory Visit: Payer: Self-pay | Admitting: Family Medicine

## 2020-11-23 DIAGNOSIS — Z1231 Encounter for screening mammogram for malignant neoplasm of breast: Secondary | ICD-10-CM

## 2020-11-23 MED ORDER — RIFAXIMIN 550 MG PO TABS: 550.0000 mg | ORAL_TABLET | Freq: Three times a day (TID) | ORAL | 0 refills | Status: AC

## 2020-11-27 ENCOUNTER — Ambulatory Visit
Admission: RE | Admit: 2020-11-27 | Discharge: 2020-11-27 | Disposition: A | Discharge status: Home / Self Care | Payer: No Typology Code available for payment source | Source: Ambulatory Visit

## 2020-11-27 ENCOUNTER — Other Ambulatory Visit: Payer: Self-pay

## 2020-11-27 DIAGNOSIS — Z1231 Encounter for screening mammogram for malignant neoplasm of breast: Secondary | ICD-10-CM

## 2020-12-13 ENCOUNTER — Telehealth: Payer: Self-pay

## 2020-12-13 NOTE — Telephone Encounter (Signed)
Dr. [name], patient just called and she wanted to know what her results were. I told her that [place] and came [place] today and once you reviewed her results, I would call her back with your recommendations. Patient understood and had no further questions.

## 2020-12-13 NOTE — Telephone Encounter (Signed)
Yes she did!

## 2020-12-14 NOTE — Telephone Encounter (Signed)
Patient is scheduled to come in on 01/27/2021 to see Dr. [name].

## 2020-12-28 ENCOUNTER — Other Ambulatory Visit (HOSPITAL_COMMUNITY)
Admission: RE | Admit: 2020-12-28 | Discharge: 2020-12-28 | Disposition: A | Discharge status: Home / Self Care | Payer: No Typology Code available for payment source | Source: Ambulatory Visit | Attending: Obstetrics and Gynecology | Admitting: Obstetrics and Gynecology

## 2020-12-28 ENCOUNTER — Ambulatory Visit: Payer: No Typology Code available for payment source | Admitting: Obstetrics and Gynecology

## 2020-12-28 ENCOUNTER — Ambulatory Visit: Payer: Self-pay

## 2020-12-28 ENCOUNTER — Encounter: Payer: Self-pay | Admitting: Obstetrics and Gynecology

## 2020-12-28 ENCOUNTER — Other Ambulatory Visit: Payer: Self-pay

## 2020-12-28 VITALS — BP 104/69 | HR 66 | Ht 66.0 in | Wt 124.0 lb

## 2020-12-28 DIAGNOSIS — R1031 Right lower quadrant pain: Secondary | ICD-10-CM

## 2020-12-28 LAB — POCT URINALYSIS DIPSTICK: Leukocytes, UA: NEGATIVE

## 2020-12-28 LAB — URINALYSIS, ROUTINE W REFLEX MICROSCOPIC
Bilirubin, UA: NEGATIVE
Glucose, UA: NEGATIVE
Ketones, UA: NEGATIVE
Leukocytes,UA: NEGATIVE
Nitrite, UA: NEGATIVE
Protein,UA: NEGATIVE
RBC, UA: NEGATIVE
Specific Gravity, UA: 1.02 (ref 1.005–1.030)
Urobilinogen, Ur: 0.2 mg/dL (ref 0.2–1.0)
pH, UA: 6.5 (ref 5.0–7.5)

## 2020-12-28 NOTE — Progress Notes (Signed)
New Patient presents for Annual Exam.  Last pap: was 68yrs ago WNL  Per notes [facility]  Last Mammogram: 11/27/20 WNL  Family Hx of Breast Cancer: None  Family Hx of Ovarian/Cervical Cancer: None Colonoscopy :4-5 yrs ago per pt had rectal bleeding. *May have bleeding w/ hard stool only.   CC: Pelvic on the right side  > 3 wks now pt states pain is increasing , 6/10x, has not tried any OTC Rx to help pt is concerned.

## 2020-12-28 NOTE — Progress Notes (Signed)
Obstetrics and Gynecology New Patient Evaluation  Appointment Date: 12/28/2020  OBGYN Clinic: [facility]  Primary Care Provider: Eustaquio Boyden  Chief Complaint: worsening RLQ pain for past month  History of Present Illness: Diane Hood is a 50 y.o. G2P2 (No LMP recorded. Patient has had a hysterectomy.), seen for the above chief complaint. Her past medical history is significant for h/o TLH ten years ago for bleeding, HRT therapy, and ovarian cystectomy pre hysterectomy  Patient with above s/s but no nausea, vomiting, fevers, chills, diarrhea, constipation, blood in BMs, lower urinary tract s/s, vaginal discharge or bleeding.    Patient has been on PO HRT for about a year for hot flashes, night sweats and she uses the estrogen PRN   Review of Systems: Pertinent items noted in HPI and remainder of comprehensive ROS otherwise negative.   Patient Active Problem List   Diagnosis Date Noted   Epigastric pain 05/26/2020   Pulmonary nodule 05/26/2020   Subclinical hyperthyroidism 04/15/2020   Allergic rhinitis 04/15/2020   Chronic kidney disease, stage 3a (HCC) 11/12/2019   Dyspepsia 02/14/2018   Slow transit constipation 02/14/2018   Acute stress disorder 06/20/2017   Chronic kidney disease, stage 2 (mild) 04/05/2017   Polycystic kidney disease 04/05/2017   History of laparoscopic-assisted vaginal hysterectomy 09/11/2016   Moderate episode of recurrent major depressive disorder (HCC) 11/22/2015   Thyroid nodule 04/06/2015   Abnormal thyroid stimulating hormone (TSH) level 12/15/2014    Past Medical History:  Past Medical History:  Diagnosis Date   Arthritis 01/2011   hospitalization for this - saw rheum - treated with prednisone x 10 months   Chronic kidney disease    Depression    Polycystic kidney disease    Renal insufficiency    Thyroid nodule 2019   focal increased uptake L lower lobe     Past Surgical History:  Past  Surgical History:  Procedure Laterality Date   ESOPHAGOGASTRODUODENOSCOPY (EGD) WITH PROPOFOL N/A 10/29/2020   Procedure: ESOPHAGOGASTRODUODENOSCOPY (EGD) WITH PROPOFOL;  Surgeon: [name], MD;  Location: [facility] ENDOSCOPY;  Service: Gastroenterology;  Laterality: N/A;   LAPAROSCOPIC OVARIAN CYSTECTOMY     unsure of side. done before her TLH.   TONSILLECTOMY  1994   TOTAL LAPAROSCOPIC HYSTERECTOMY WITH SALPINGECTOMY  2013   bleeding    Past Obstetrical History:  OB History  Gravida Para Term Preterm AB Living  2         2  SAB IAB Ectopic Multiple Live Births               # Outcome Date GA Lbr Len/2nd Weight Sex Delivery Anes PTL Lv  2 Gravida           1 Gravida             Obstetric Comments  Vaginal x 2    Past Gynecological History: As per HPI.  Social History:  Social History   Socioeconomic History   Marital status: Married    Spouse name: Not on file   Number of children: Not on file   Years of education: Not on file   Highest education level: Not on file  Occupational History   Not on file  Tobacco Use   Smoking status: Never   Smokeless tobacco: Never  Vaping Use   Vaping Use: Never used  Substance and Sexual Activity   Alcohol use: Never   Drug use: Never   Sexual activity: Yes  Other Topics Concern   Not  on file  Social History Narrative   From [place]   Moved to the area 12/2018 from [place] ([place]) prior lived in [place] with husband (from [place]) 2 children    Occ: housewife    Activity: yoga, walking   Diet: good water, fruits/vegetables daily    Social Determinants of Health   Financial Resource Strain: Not on file  Food Insecurity: Not on file  Transportation Needs: Not on file  Physical Activity: Not on file  Stress: Not on file  Social Connections: Not on file  Intimate Partner Violence: Not on file    Family History:  Family History  Problem Relation Age of Onset   Heart attack Father 89       MI   Diabetes  Father    Hyperlipidemia Father    Diverticulitis Father        ? s/p intestinal surgery   Cancer Neg Hx    Stroke Neg Hx    Breast cancer Neg Hx     Health Maintenance:  Mammogram(s): Yes.   Date: 11/2020 Colonoscopy: Yes.   Date: 5 years ago and normal per patient for an episode of blood in BMs  Medications Ryla Stovall had no medications administered during this visit. Current Outpatient Medications  Medication Sig Dispense Refill   cetirizine (ZYRTEC) 10 MG tablet Take 1 tablet (10 mg total) by mouth daily.     estradiol (ESTRACE) 1 MG tablet Take 1 tablet (1 mg total) by mouth daily. 90 tablet 3   Cholecalciferol 125 MCG (5000 UT) TABS 1 Tablet(s) P.O. daily (Patient not taking: Reported on 12/28/2020)     fluticasone (FLONASE) 50 MCG/ACT nasal spray Place 2 sprays into both nostrils daily. (Patient not taking: Reported on 12/28/2020)     hydrocortisone (ANUSOL-HC) 25 MG suppository Place 1 suppository (25 mg total) rectally 2 (two) times daily as needed for hemorrhoids. (Patient not taking: Reported on 12/28/2020) 12 suppository 0   metoCLOPramide (REGLAN) 10 MG tablet Take 1 tablet (10 mg total) by mouth 3 (three) times daily as needed for nausea. (Patient not taking: Reported on 12/28/2020) 20 tablet 0   Pancrelipase, Lip-Prot-Amyl, 24000-76000 units CPEP Take 2 capsules per meal and 1 capsule per snack daily. (Patient not taking: Reported on 12/28/2020) 56 capsule 0   sertraline (ZOLOFT) 50 MG tablet Take 1.5 tablets (75 mg total) by mouth daily. (Patient not taking: Reported on 12/28/2020) 135 tablet 3   valACYclovir (VALTREX) 500 MG tablet TAKE 2 TABLETS BY MOUTH AS NEEDED (Patient not taking: Reported on 12/28/2020) 20 tablet 1   No current facility-administered medications for this visit.    Allergies Patient has no allergy information on record.   Physical Exam:  BP 104/69    Pulse 66    Ht 5\' 6"  (1.676 m)    Wt 124 lb (56.2 kg)    BMI 20.01 kg/m  Body mass index  is 20.01 kg/m.  General appearance: Well nourished, well developed female in no acute distress.  Cardiovascular: normal s1 and s2.  No murmurs, rubs or gallops. Respiratory:  Clear to auscultation bilateral. Normal respiratory effort Abdomen: positive bowel sounds and no masses, hernias; diffusely non tender to palpation, non distended Neuro/Psych:  Normal mood and affect.  Skin:  Warm and dry.  Lymphatic:  No inguinal lymphadenopathy.   Pelvic exam: is not limited by body habitus EGBUS: within normal limits Vagina: within normal limits and with no blood or discharge in the vault Cervix: surgically  absent Cuff: normal, nttp Bimanual: negative  Laboratory: u dip trace, neg leuks  Radiology:  Bedside transvag u/s done and no ovaries seen. Only bowel seen. No free fluid in the pelvis  Per care everywhere, patient had a negative tvus in 2020 for dyspareunia with ovaries seen.   Assessment: pt stable  Plan:  1. RLQ abdominal pain F/u ucx and formal u/a. Vaginal pap done. No obvious gyn etiology. ED precautions given. If s/s persist next month and/or worsen, I told her to call the office and will repeat her u/s.  - US PELVIS TRANSVAGINAL NON-OB (TV ONLY); Future - Cytology - PAP( [facility]) - POCT Urinalysis Dipstick - Urine Culture  Orders Placed This Encounter  Procedures   Urine Culture   US PELVIS TRANSVAGINAL NON-OB (TV ONLY)   POCT Urinalysis Dipstick    RTC PRN  Durene Romans MD Attending [facility])

## 2020-12-31 ENCOUNTER — Encounter: Payer: Self-pay | Admitting: Gastroenterology

## 2020-12-31 LAB — CYTOLOGY - PAP
Chlamydia: NEGATIVE
Comment: NEGATIVE
Comment: NEGATIVE
Comment: NEGATIVE
Comment: NORMAL
Diagnosis: NEGATIVE
High risk HPV: NEGATIVE
Neisseria Gonorrhea: NEGATIVE
Trichomonas: NEGATIVE

## 2020-12-31 LAB — URINE CULTURE: Organism ID, Bacteria: NO GROWTH

## 2021-01-27 ENCOUNTER — Ambulatory Visit (INDEPENDENT_AMBULATORY_CARE_PROVIDER_SITE_OTHER): Payer: PRIVATE HEALTH INSURANCE | Admitting: Gastroenterology

## 2021-01-27 ENCOUNTER — Encounter: Payer: Self-pay | Admitting: Gastroenterology

## 2021-01-27 ENCOUNTER — Telehealth: Payer: Self-pay

## 2021-01-27 VITALS — BP 114/77 | HR 80 | Temp 98.0°F | Wt 125.2 lb

## 2021-01-27 DIAGNOSIS — K580 Irritable bowel syndrome with diarrhea: Secondary | ICD-10-CM | POA: Diagnosis not present

## 2021-01-27 DIAGNOSIS — R140 Abdominal distension (gaseous): Secondary | ICD-10-CM | POA: Diagnosis not present

## 2021-01-27 NOTE — Patient Instructions (Signed)
Please take Zenpep as follow: 2 Capsules with meals and 1 capsule with snacks a total of (8 Capsules a day).

## 2021-01-27 NOTE — Telephone Encounter (Signed)
Called [facility] but they were closed due to inclement weather. I will then call tomorrow to ask for a copy of patient's SIBO test result so it could be in her chart. Patient also wants a copy.

## 2021-01-27 NOTE — Progress Notes (Signed)
Diane Mood MD, MRCP(U.K) [address]  [address]  [place], [place] [address]  Main: [phone]  Fax: [phone]   Primary Care Physician: Eustaquio Boyden, MD  Primary Gastroenterologist:  Dr. Wyline Hood   Chief complaint: Follow-up for bacterial overgrowth syndrome  HPI: Diane Hood is a 51 y.o. female  Summary of history :   Initially referred and seen on 08/05/2020 for epigastric and rectal pain.Previously been seen at a gastroenterology practice in [place].  As per last office note she has had excess bloating and abdominal pain in 2020 colonoscopy in 2018 was normal and repeat was recommended in 10 years at that point of time she has had a CT scan of the abdomen which showed no gross abnormality.   H. pylori stool antigen was negative in June 2022, in May 2022 hemoglobin of 13.4 g At prior visit she had issues with bloating after meals with a lot of gurgling sounds concerns for bacterial overgrowth and treated with Xifaxan and August 2022.  Also essential component of osmotic diarrhea from last quantity of artificial sweeteners in the diet.    Interval history 09/23/2020-01/27/2021  10/29/2020: EYC:XKGYJE appearance She states that after her endoscopy we give her a course of Xifaxan which helped a bit but not completely and subsequently had recurrence of bloating gas and rumbling sounds in her abdomen.  Given a course of Creon it helped a bit but when we tried to get a prescription through her insurance it was not covered.  He subsequently had the bacterial overgrowth breath test which she says was negative but cannot find the report in the system.  Presently has continued symptoms she has not had a colonoscopy in about 4 years..  Current Outpatient Medications  Medication Sig Dispense Refill   Cholecalciferol 125 MCG (5000 UT) TABS      estradiol (ESTRACE) 1 MG tablet Take 1 tablet (1 mg total) by mouth daily. 90 tablet 3   metoCLOPramide (REGLAN) 10 MG tablet Take  1 tablet (10 mg total) by mouth 3 (three) times daily as needed for nausea. 20 tablet 0   sertraline (ZOLOFT) 50 MG tablet Take 1.5 tablets (75 mg total) by mouth daily. 135 tablet 3   cetirizine (ZYRTEC) 10 MG tablet Take 1 tablet (10 mg total) by mouth daily. (Patient not taking: Reported on 01/27/2021)     fluticasone (FLONASE) 50 MCG/ACT nasal spray Place 2 sprays into both nostrils daily. (Patient not taking: Reported on 01/27/2021)     hydrocortisone (ANUSOL-HC) 25 MG suppository Place 1 suppository (25 mg total) rectally 2 (two) times daily as needed for hemorrhoids. (Patient not taking: Reported on 01/27/2021) 12 suppository 0   valACYclovir (VALTREX) 500 MG tablet TAKE 2 TABLETS BY MOUTH AS NEEDED (Patient not taking: Reported on 01/27/2021) 20 tablet 1   No current facility-administered medications for this visit.    Allergies as of 01/27/2021   (No Known Allergies)    ROS:  General: Negative for anorexia, weight loss, fever, chills, fatigue, weakness. ENT: Negative for hoarseness, difficulty swallowing , nasal congestion. CV: Negative for chest pain, angina, palpitations, dyspnea on exertion, peripheral edema.  Respiratory: Negative for dyspnea at rest, dyspnea on exertion, cough, sputum, wheezing.  GI: See history of present illness. GU:  Negative for dysuria, hematuria, urinary incontinence, urinary frequency, nocturnal urination.  Endo: Negative for unusual weight change.    Physical Examination:   BP 114/77    Pulse 80    Temp 98 F (36.7 C) (Oral)  Wt 125 lb 3.2 oz (56.8 kg)    BMI 20.21 kg/m   General: Well-nourished, well-developed in no acute distress.  Eyes: No icterus. Conjunctivae pink. Neuro: Alert and oriented x 3.  Grossly intact. Skin: Warm and dry, no jaundice.   Psych: Alert and cooperative, normal Hood and affect.   Imaging Studies: No results found.  Assessment and Plan:   Diane Hood is a 51 y.o. y/o female  here to follow-up for IBS-D with  diarrhea completely resolved after a course of Xifaxan.  Present symptoms are more of bloating distention and sometimes a sensation of food sitting in the stomach for long periods of time.  Differentials include bacterial overgrowth syndrome versus gastroparesis versus sucrose deficiency.   Plan 1.  Continue charcoal tablets.  I will give her a box to test for sucrase deficiency.  Trial of Zenpep 1 week samples provided.  Avoid artificial sugars and sweeteners.  At next visit if no better could consider colonoscopy although it is possible that her symptoms are functional. 2.  I will have my office to find out the results of her breath test to confirm that it was truly negative and have it scanned in the chart Dr Jonathon Bellows  MD,MRCP [facility]) Follow up in 4 weeks

## 2021-01-30 ENCOUNTER — Encounter: Payer: Self-pay | Admitting: Family Medicine

## 2021-01-30 DIAGNOSIS — K580 Irritable bowel syndrome with diarrhea: Secondary | ICD-10-CM | POA: Insufficient documentation

## 2021-02-02 ENCOUNTER — Other Ambulatory Visit: Payer: Self-pay

## 2021-02-02 NOTE — Telephone Encounter (Signed)
Called [facility] again and I was able to request the results of patient's SIBO. They will be faxing the results.

## 2021-02-22 ENCOUNTER — Other Ambulatory Visit: Payer: Self-pay

## 2021-02-22 DIAGNOSIS — R140 Abdominal distension (gaseous): Secondary | ICD-10-CM

## 2021-02-22 DIAGNOSIS — K580 Irritable bowel syndrome with diarrhea: Secondary | ICD-10-CM

## 2021-02-22 MED ORDER — NA SULFATE-K SULFATE-MG SULF 17.5-3.13-1.6 GM/177ML PO SOLN: 354.0000 mL | Freq: Once | ORAL | 0 refills | Status: AC

## 2021-02-23 ENCOUNTER — Other Ambulatory Visit: Payer: Self-pay

## 2021-02-23 MED ORDER — CLENPIQ 10-3.5-12 MG-GM -GM/160ML PO SOLN: ORAL | 0 refills | Status: DC

## 2021-02-24 ENCOUNTER — Other Ambulatory Visit: Payer: Self-pay

## 2021-02-24 MED ORDER — PEG 3350-KCL-NA BICARB-NACL 420 G PO SOLR: ORAL | 0 refills | Status: DC

## 2021-02-28 ENCOUNTER — Encounter: Payer: Self-pay | Admitting: Gastroenterology

## 2021-03-01 ENCOUNTER — Encounter: Payer: Self-pay | Admitting: Gastroenterology

## 2021-03-01 ENCOUNTER — Ambulatory Visit: Payer: PRIVATE HEALTH INSURANCE | Admitting: Anesthesiology

## 2021-03-01 ENCOUNTER — Ambulatory Visit
Admission: RE | Admit: 2021-03-01 | Discharge: 2021-03-01 | Disposition: A | Discharge status: Home / Self Care | Payer: PRIVATE HEALTH INSURANCE | Attending: Gastroenterology | Admitting: Gastroenterology

## 2021-03-01 ENCOUNTER — Encounter
Admission: RE | Disposition: A | Discharge status: Home / Self Care | Payer: Self-pay | Source: Home / Self Care | Attending: Gastroenterology

## 2021-03-01 DIAGNOSIS — F32A Depression, unspecified: Secondary | ICD-10-CM | POA: Insufficient documentation

## 2021-03-01 DIAGNOSIS — K580 Irritable bowel syndrome with diarrhea: Secondary | ICD-10-CM | POA: Diagnosis not present

## 2021-03-01 DIAGNOSIS — N189 Chronic kidney disease, unspecified: Secondary | ICD-10-CM | POA: Insufficient documentation

## 2021-03-01 DIAGNOSIS — K529 Noninfective gastroenteritis and colitis, unspecified: Secondary | ICD-10-CM | POA: Diagnosis present

## 2021-03-01 DIAGNOSIS — F419 Anxiety disorder, unspecified: Secondary | ICD-10-CM | POA: Diagnosis not present

## 2021-03-01 DIAGNOSIS — R140 Abdominal distension (gaseous): Secondary | ICD-10-CM

## 2021-03-01 HISTORY — PX: COLONOSCOPY WITH PROPOFOL: SHX5780

## 2021-03-01 SURGERY — COLONOSCOPY WITH PROPOFOL
Anesthesia: General

## 2021-03-01 MED ORDER — PROPOFOL 10 MG/ML IV BOLUS
INTRAVENOUS | Status: DC | PRN
Start: 2021-03-01 — End: 2021-03-01
  Administered 2021-03-01: 20 mg via INTRAVENOUS
  Administered 2021-03-01: 60 mg via INTRAVENOUS
  Administered 2021-03-01: 20 mg via INTRAVENOUS
  Administered 2021-03-01: 40 mg via INTRAVENOUS

## 2021-03-01 MED ORDER — PROPOFOL 500 MG/50ML IV EMUL
INTRAVENOUS | Status: DC | PRN
  Administered 2021-03-01: 150 ug/kg/min via INTRAVENOUS

## 2021-03-01 MED ORDER — MIDAZOLAM HCL 2 MG/2ML IJ SOLN
INTRAMUSCULAR | Status: DC | PRN
  Administered 2021-03-01: 2 mg via INTRAVENOUS

## 2021-03-01 MED ORDER — SODIUM CHLORIDE 0.9 % IV SOLN
INTRAVENOUS | Status: DC
  Administered 2021-03-01: 1000 mL via INTRAVENOUS

## 2021-03-01 MED ORDER — MIDAZOLAM HCL 2 MG/2ML IJ SOLN
INTRAMUSCULAR | Status: AC
  Filled 2021-03-01: qty 2

## 2021-03-01 NOTE — H&P (Signed)
Jonathon Bellows, MD [address], [place], [place], [place], [address] [address], [place], [place], [place], [address] Phone: [phone]  Fax: [phone]  Primary Care Physician:  Ria Bush, MD   Pre-Procedure History & Physical: HPI:  Diane Hood is a 51 y.o. female is here for an colonoscopy.   Past Medical History:  Diagnosis Date   Arthritis 01/2011   hospitalization for this - saw rheum - treated with prednisone x 10 months   Chronic kidney disease    Depression    Polycystic kidney disease    Renal insufficiency    Thyroid nodule 2019   focal increased uptake L lower lobe     Past Surgical History:  Procedure Laterality Date   ABDOMINAL HYSTERECTOMY     ESOPHAGOGASTRODUODENOSCOPY (EGD) WITH PROPOFOL N/A 10/29/2020   Procedure: ESOPHAGOGASTRODUODENOSCOPY (EGD) WITH PROPOFOL;  Surgeon: Jonathon Bellows, MD;  Location: [facility] ENDOSCOPY;  Service: Gastroenterology;  Laterality: N/A;   LAPAROSCOPIC OVARIAN CYSTECTOMY     unsure of side. done before her TLH.   TONSILLECTOMY  1994   TOTAL LAPAROSCOPIC HYSTERECTOMY WITH SALPINGECTOMY  2013   bleeding    Prior to Admission medications   Medication Sig Start Date End Date Taking? Authorizing Provider  Cholecalciferol 125 MCG (5000 UT) TABS  04/05/17  Yes [provider]  estradiol (ESTRACE) 1 MG tablet Take 1 tablet (1 mg total) by mouth daily. 11/16/20  Yes Ria Bush, MD  polyethylene glycol-electrolytes (NULYTELY) 420 g solution Prepare according to package instructions. Starting at 5:00 PM: Drink one 8 oz glass of mixture every 15 minutes until you finish half of the jug. Five hours prior to procedure, drink 8 oz glass of mixture every 15 minutes until it is all gone. Make sure you do not drink anything 4 hours prior to your procedure. 02/24/21  Yes Jonathon Bellows, MD  sertraline (ZOLOFT) 50 MG tablet Take 1.5 tablets (75 mg total) by mouth daily. 07/29/20  Yes Ria Bush, MD  Sod  Picosulfate-Mag Ox-Cit Acd (CLENPIQ) 10-3.5-12 MG-GM -GM/160ML SOLN Take 1 bottle at 5 PM followed by five 8 oz cups of water and repeat 5 hours before procedure. 02/23/21  Yes Jonathon Bellows, MD  cetirizine (ZYRTEC) 10 MG tablet Take 1 tablet (10 mg total) by mouth daily. Patient not taking: Reported on 01/27/2021 04/15/20   Ria Bush, MD  fluticasone [facility]) 50 MCG/ACT nasal spray Place 2 sprays into both nostrils daily. Patient not taking: Reported on 01/27/2021 04/15/20   Ria Bush, MD  hydrocortisone (ANUSOL-HC) 25 MG suppository Place 1 suppository (25 mg total) rectally 2 (two) times daily as needed for hemorrhoids. Patient not taking: Reported on 01/27/2021 06/16/20   Ria Bush, MD  metoCLOPramide (REGLAN) 10 MG tablet Take 1 tablet (10 mg total) by mouth 3 (three) times daily as needed for nausea. Patient not taking: Reported on 03/01/2021 09/23/20   Jonathon Bellows, MD  valACYclovir (VALTREX) 500 MG tablet TAKE 2 TABLETS BY MOUTH AS NEEDED Patient not taking: Reported on 01/27/2021 12/04/19   Elby Beck, FNP    Allergies as of 02/22/2021   (No Known Allergies)    Family History  Problem Relation Age of Onset   Heart attack Father 96       MI   Diabetes Father    Hyperlipidemia Father    Diverticulitis Father        ? s/p intestinal surgery   Cancer Neg Hx    Stroke Neg Hx  Breast cancer Neg Hx     Social History   Socioeconomic History   Marital status: Married    Spouse name: Not on file   Number of children: Not on file   Years of education: Not on file   Highest education level: Not on file  Occupational History   Not on file  Tobacco Use   Smoking status: Never   Smokeless tobacco: Never  Vaping Use   Vaping Use: Never used  Substance and Sexual Activity   Alcohol use: Never   Drug use: Never   Sexual activity: Yes  Other Topics Concern   Not on file  Social History Narrative   From [place]   Moved to the area 12/2018 from [place]  ([place]) prior lived in [place] with husband (from [place]) 2 children    Occ: housewife    Activity: yoga, walking   Diet: good water, fruits/vegetables daily    Social Determinants of Health   Financial Resource Strain: Not on file  Food Insecurity: Not on file  Transportation Needs: Not on file  Physical Activity: Not on file  Stress: Not on file  Social Connections: Not on file  Intimate Partner Violence: Not on file    Review of Systems: See HPI, otherwise negative ROS  Physical Exam: BP (!) 103/54    Pulse 70    Temp (!) 96.5 F (35.8 C) (Temporal)    Resp 16    Ht 5\' 6"  (1.676 m)    Wt 57.3 kg    SpO2 100%    BMI 20.38 kg/m  General:   Alert,  pleasant and cooperative in NAD Head:  Normocephalic and atraumatic. Neck:  Supple; no masses or thyromegaly. Lungs:  Clear throughout to auscultation, normal respiratory effort.    Heart:  +S1, +S2, Regular rate and rhythm, No edema. Abdomen:  Soft, nontender and nondistended. Normal bowel sounds, without guarding, and without rebound.   Neurologic:  Alert and  oriented x4;  grossly normal neurologically.  Impression/Plan: Diane Hood is here for an colonoscopy to be performed for diarrhea.  Risks, benefits, limitations, and alternatives regarding  colonoscopy have been reviewed with the patient.  Questions have been answered.  All parties agreeable.   Jonathon Bellows, MD  03/01/2021, 7:53 AM

## 2021-03-01 NOTE — Op Note (Signed)
[facility] Patient Name: Diane Hood Procedure Date: 03/01/2021 7:55 AM MRN: [id number] Account #: [id number] Date of Birth: August 04, 1970 Admit Type: Outpatient Age: 51 Room: [facility] ENDO ROOM 2 Gender: Female Note Status: Finalized Instrument Name: Park Meo [id number] Procedure:             Colonoscopy Indications:           Chronic diarrhea Providers:             Jonathon Bellows MD, MD Medicines:             Monitored Anesthesia Care Complications:         No immediate complications. Procedure:             Pre-Anesthesia Assessment:                        - Prior to the procedure, a History and Physical was                         performed, and patient medications, allergies and                         sensitivities were reviewed. The patient's tolerance                         of previous anesthesia was reviewed.                        - The risks and benefits of the procedure and the                         sedation options and risks were discussed with the                         patient. All questions were answered and informed                         consent was obtained.                        - ASA Grade Assessment: II - A patient with mild                         systemic disease.                        After obtaining informed consent, the colonoscope was                         passed under direct vision. Throughout the procedure,                         the patient's blood pressure, pulse, and oxygen                         saturations were monitored continuously. The                         Colonoscope was introduced through the anus and  advanced to the the cecum, identified by the                         appendiceal orifice. The colonoscopy was performed                         with ease. The patient tolerated the procedure well.                         The quality of the bowel preparation was  excellent. Findings:      The perianal and digital rectal examinations were normal.      The terminal ileum appeared normal. Biopsies were taken with a cold       forceps for histology.      The colon (entire examined portion) appeared normal. Biopsies were taken       with a cold forceps for histology.      The exam was otherwise without abnormality. Impression:            - The examined portion of the ileum was normal.                         Biopsied.                        - The entire examined colon is normal. Biopsied.                        - The examination was otherwise normal. Recommendation:        - Discharge patient to home (with escort).                        - Resume previous diet.                        - Continue present medications.                        - Await pathology results.                        - Repeat colonoscopy in 10 years for screening                         purposes.                        - Return to my office as previously scheduled. Procedure Code(s):     --- Professional ---                        [phone], Colonoscopy, flexible; with biopsy, single or                         multiple Diagnosis Code(s):     --- Professional ---                        K52.9, Noninfective gastroenteritis and colitis,                         unspecified CPT copyright 2019 [employer]  [employer]. All rights reserved. The codes documented in this report are preliminary and upon coder review may  be revised to meet current compliance requirements. Jonathon Bellows, MD Jonathon Bellows MD, MD 03/01/2021 8:15:08 AM This report has been signed electronically. Number of Addenda: 0 Note Initiated On: 03/01/2021 7:55 AM Scope Withdrawal Time: 0 hours 11 minutes 44 seconds  Total Procedure Duration: 0 hours 15 minutes 3 seconds  Estimated Blood Loss:  Estimated blood loss: none.      [facility]

## 2021-03-01 NOTE — Anesthesia Preprocedure Evaluation (Addendum)
Anesthesia Evaluation  Patient identified by MRN, date of birth, ID band Patient awake    Reviewed: Allergy & Precautions, NPO status , Patient's Chart, lab work & pertinent test results  Airway Mallampati: II  TM Distance: >3 FB Neck ROM: full    Dental no notable dental hx.    Pulmonary neg pulmonary ROS,    Pulmonary exam normal        Cardiovascular negative cardio ROS Normal cardiovascular exam     Neuro/Psych PSYCHIATRIC DISORDERS Anxiety Depression negative neurological ROS     GI/Hepatic Neg liver ROS, Diarrhea, bloating   Endo/Other  negative endocrine ROS  Renal/GU CRFRenal disease  negative genitourinary   Musculoskeletal  (+) Arthritis ,   Abdominal Normal abdominal exam  (+)   Peds  Hematology negative hematology ROS (+)   Anesthesia Other Findings Past Medical History: 01/2011: Arthritis     Comment:  hospitalization for this - saw rheum - treated with               prednisone x 10 months No date: Chronic kidney disease No date: Depression No date: Polycystic kidney disease No date: Renal insufficiency 2019: Thyroid nodule     Comment:  focal increased uptake L lower lobe   Past Surgical History: No date: ABDOMINAL HYSTERECTOMY 10/29/2020: ESOPHAGOGASTRODUODENOSCOPY (EGD) WITH PROPOFOL; N/A     Comment:  Procedure: ESOPHAGOGASTRODUODENOSCOPY (EGD) WITH               PROPOFOL;  Surgeon: [name], MD;  Location: [facility]               ENDOSCOPY;  Service: Gastroenterology;  Laterality: N/A; No date: LAPAROSCOPIC OVARIAN CYSTECTOMY     Comment:  unsure of side. done before her TLH. 1994: TONSILLECTOMY 2013: TOTAL LAPAROSCOPIC HYSTERECTOMY WITH SALPINGECTOMY     Comment:  bleeding     Reproductive/Obstetrics negative OB ROS                            Anesthesia Physical Anesthesia Plan  ASA: 2  Anesthesia Plan: General   Post-op Pain Management:     Induction: Intravenous  PONV Risk Score and Plan: Propofol infusion and TIVA  Airway Management Planned: Natural Airway and Nasal Cannula  Additional Equipment:   Intra-op Plan:   Post-operative Plan:   Informed Consent: I have reviewed the patients History and Physical, chart, labs and discussed the procedure including the risks, benefits and alternatives for the proposed anesthesia with the patient or authorized representative who has indicated his/her understanding and acceptance.     Dental advisory given  Plan Discussed with: Anesthesiologist and CRNA  Anesthesia Plan Comments:        Anesthesia Quick Evaluation

## 2021-03-01 NOTE — Anesthesia Postprocedure Evaluation (Signed)
Anesthesia Post Note  Patient: Diane Hood  Procedure(s) Performed: COLONOSCOPY WITH PROPOFOL  Patient location during evaluation: Endoscopy Anesthesia Type: General Level of consciousness: awake and alert Pain management: pain level controlled Vital Signs Assessment: post-procedure vital signs reviewed and stable Respiratory status: spontaneous breathing, nonlabored ventilation and respiratory function stable Cardiovascular status: blood pressure returned to baseline and stable Postop Assessment: no apparent nausea or vomiting Anesthetic complications: no   No notable events documented.   Last Vitals:  Vitals:   03/01/21 0828 03/01/21 0841  BP: (!) 88/48 99/62  Pulse: 91 79  Resp: 17 20  Temp: (!) 35.9 C   SpO2: 100% 100%    Last Pain:  Vitals:   03/01/21 0841  TempSrc:   PainSc: 0-No pain                 Foye Deer

## 2021-03-01 NOTE — Transfer of Care (Signed)
Immediate Anesthesia Transfer of Care Note  Patient: Diane Hood  Procedure(s) Performed: COLONOSCOPY WITH PROPOFOL  Patient Location: PACU  Anesthesia Type:General  Level of Consciousness: drowsy  Airway & Oxygen Therapy: Patient Spontanous Breathing  Post-op Assessment: Report given to RN and Post -op Vital signs reviewed and stable  Post vital signs: Reviewed and stable  Last Vitals:  Vitals Value Taken Time  BP 89/44 03/01/21 0818  Temp    Pulse 92 03/01/21 0818  Resp 17 03/01/21 0818  SpO2 99 % 03/01/21 0818  Vitals shown include unvalidated device data.  Last Pain:  Vitals:   03/01/21 0712  TempSrc: Temporal  PainSc: 0-No pain         Complications: No notable events documented.

## 2021-03-01 NOTE — Anesthesia Procedure Notes (Signed)
Date/Time: 03/01/2021 7:54 AM Performed by: Demetrius Charity, CRNA Pre-anesthesia Checklist: Patient identified, Patient being monitored, Timeout performed, Emergency Drugs available and Suction available Oxygen Delivery Method: Nasal cannula Placement Confirmation: CO2 detector and positive ETCO2

## 2021-03-02 ENCOUNTER — Ambulatory Visit: Payer: PRIVATE HEALTH INSURANCE | Admitting: Gastroenterology

## 2021-03-02 ENCOUNTER — Encounter: Payer: Self-pay | Admitting: Gastroenterology

## 2021-03-02 LAB — SURGICAL PATHOLOGY

## 2021-03-06 ENCOUNTER — Encounter: Payer: Self-pay | Admitting: Gastroenterology

## 2021-03-09 ENCOUNTER — Encounter: Payer: Self-pay | Admitting: Gastroenterology

## 2021-03-09 NOTE — Telephone Encounter (Signed)
Inform   All tests we have done so far are negative. She probably does have bloating related to the foods she is eating and the way its being broken down by the bacteria.   Options at this time 1. See a dietician  2. Low fodmap diet  3. Second opionion at [facility] or [facility] 4. I am willing to see her and discuss all options with her in person

## 2021-03-09 NOTE — Telephone Encounter (Signed)
Did we get results of the sucrase deficiency test ?

## 2021-03-11 ENCOUNTER — Other Ambulatory Visit: Payer: PRIVATE HEALTH INSURANCE

## 2021-04-19 ENCOUNTER — Encounter: Payer: Self-pay | Admitting: Family Medicine

## 2021-04-19 ENCOUNTER — Ambulatory Visit (INDEPENDENT_AMBULATORY_CARE_PROVIDER_SITE_OTHER): Payer: PRIVATE HEALTH INSURANCE | Admitting: Family Medicine

## 2021-04-19 VITALS — BP 114/72 | HR 66 | Temp 97.8°F | Ht 64.0 in | Wt 126.0 lb

## 2021-04-19 DIAGNOSIS — K580 Irritable bowel syndrome with diarrhea: Secondary | ICD-10-CM

## 2021-04-19 DIAGNOSIS — Z23 Encounter for immunization: Secondary | ICD-10-CM | POA: Diagnosis not present

## 2021-04-19 DIAGNOSIS — N1831 Chronic kidney disease, stage 3a: Secondary | ICD-10-CM

## 2021-04-19 DIAGNOSIS — E041 Nontoxic single thyroid nodule: Secondary | ICD-10-CM

## 2021-04-19 DIAGNOSIS — F331 Major depressive disorder, recurrent, moderate: Secondary | ICD-10-CM

## 2021-04-19 DIAGNOSIS — Z0001 Encounter for general adult medical examination with abnormal findings: Secondary | ICD-10-CM

## 2021-04-19 DIAGNOSIS — E0590 Thyrotoxicosis, unspecified without thyrotoxic crisis or storm: Secondary | ICD-10-CM | POA: Diagnosis not present

## 2021-04-19 DIAGNOSIS — Z0000 Encounter for general adult medical examination without abnormal findings: Secondary | ICD-10-CM | POA: Insufficient documentation

## 2021-04-19 DIAGNOSIS — Q613 Polycystic kidney, unspecified: Secondary | ICD-10-CM | POA: Diagnosis not present

## 2021-04-19 DIAGNOSIS — R1013 Epigastric pain: Secondary | ICD-10-CM

## 2021-04-19 LAB — T4, FREE: Free T4: 0.81 ng/dL (ref 0.60–1.60)

## 2021-04-19 LAB — VITAMIN D 25 HYDROXY (VIT D DEFICIENCY, FRACTURES): VITD: 76.2 ng/mL (ref 30.00–100.00)

## 2021-04-19 LAB — LIPID PANEL
Cholesterol: 216 mg/dL — ABNORMAL HIGH (ref 0–200)
HDL: 61.5 mg/dL (ref >39.00)
LDL Cholesterol: 141 mg/dL — ABNORMAL HIGH (ref 0–99)
NonHDL: 154.03
Total CHOL/HDL Ratio: 4
Triglycerides: 66 mg/dL (ref 0.0–149.0)
VLDL: 13.2 mg/dL (ref 0.0–40.0)

## 2021-04-19 LAB — TSH: TSH: 0.46 u[IU]/mL (ref 0.35–5.50)

## 2021-04-19 NOTE — Assessment & Plan Note (Addendum)
Ongoing s/p extensive GI evaluation.  ??functional abd pain.  ?Pending second opinion through [facility] - awaiting scheduling.  ?Trial IBGuard.  ?Consider Effexor in place of sertraline.  ?

## 2021-04-19 NOTE — Assessment & Plan Note (Signed)
S/p extensive GI eval, [facility] opinion at academic center. See above.  ?

## 2021-04-19 NOTE — Assessment & Plan Note (Signed)
Preventative protocols reviewed and updated unless pt declined. Discussed healthy diet and lifestyle.  

## 2021-04-19 NOTE — Assessment & Plan Note (Signed)
Partially hyperfunctioning left lower pole nodule by thyroid uptake scan 2019.  ?

## 2021-04-19 NOTE — Assessment & Plan Note (Deleted)
Appreciate renal care in h/o PCKD ?

## 2021-04-19 NOTE — Assessment & Plan Note (Signed)
Stable period on current regimen. See above.  ?

## 2021-04-19 NOTE — Assessment & Plan Note (Signed)
Appreciate renal care in h/o PCKD ?

## 2021-04-19 NOTE — Progress Notes (Signed)
? ? Patient ID: Diane Hood, female    DOB: 05-Nov-1970, 51 y.o.   MRN: [id number] ? ?This visit was conducted in person. ? ?BP 114/72   Pulse 66   Temp 97.8 ?F (36.6 ?C) (Temporal)   Ht 5\' 4"  (1.626 m)   Wt 126 lb (57.2 kg)   SpO2 97%   BMI 21.63 kg/m?   ? ?CC: CPE ?Subjective:  ? ?HPI: ?Diane Hood is a 51 y.o. female presenting on 04/19/2021 for Annual Exam ? ?  ?Polycystic kidney disease with hematuria followed by nephrology Dr Juleen China.  ? ?RLQ abd pain - saw gyn - no obvious GYN cause. Pelvic US returned normal. She is on sertraline 75mg  (for 1+ year) and estradiol for hot flashes (recently restarted 1 month ago).  ? ?IBS, SIBO with epigastric pain - H pylori stool Ag neg 06/2020, s/p xifaxan treatment 08/2020 with partial benefit. Creon also helpful but not covered by insurance. EGD reassuring 10/2020. Also tried charcoal tablets without much benefit. Rec avoid artificial sweeteners/sugars. Ongoing significant bloating and gurgling with and without food. Low FODMAP diet didn't help.  ? ?Preventative: ?Colonoscopy 2018 WNL in [place]  ?Colonoscopy 2023 - WNL Vicente Males) biopsies WNL ?EGD 2023 - WNL Vicente Males) biopsies WNL ?[facility] cancer [facility]  ?Well woman exam - GYN Dr Ilda Basset - pap WNL 12/2020 ?Perimenopausal - ongoing hot flashes since ~2018. S/p hysterectomy in 2013 at age 25yo due to heavy bleeding, ovaries remain ?DEXA scan - not due  ?Lung cancer screening - not eligible ?Flu shot - declined ?COVID shot [employer] 03/2019, 04/2019, booster 12/2019 ?Tdap 2019 ?Pneumonia shot - not due ?Shingrix - discussed  ?Advanced directive discussion -  ?Seat belt use discussed ?Sunscreen use discussed. No changing moles on skin.  ?Sleep - averaging 8 hours/night ?Non smoker ?Alcohol - none ?Dentist - q6 mo ?Eye exam - yearly  ? ?Moved to the area 12/2018 from [place]) prior lived in [place] ?Lives with husband 2 children  ?Occ: housewife  ?Activity: yoga, meditating,  walking ?Diet: good water, fruits/vegetables daily  ?   ? ?Relevant past medical, surgical, family and social history reviewed and updated as indicated. Interim medical history since our last visit reviewed. ?Allergies and medications reviewed and updated. ?Outpatient Medications Prior to Visit  ?Medication Sig Dispense Refill  ? estradiol (ESTRACE) 1 MG tablet Take 1 tablet (1 mg total) by mouth daily. 90 tablet 3  ? loratadine (CLARITIN) 10 MG tablet Take 10 mg by mouth daily.    ? sertraline (ZOLOFT) 50 MG tablet Take 1.5 tablets (75 mg total) by mouth daily. 135 tablet 3  ? valACYclovir (VALTREX) 500 MG tablet TAKE 2 TABLETS BY MOUTH AS NEEDED 20 tablet 1  ? Cholecalciferol 125 MCG (5000 UT) TABS     ? cetirizine (ZYRTEC) 10 MG tablet Take 1 tablet (10 mg total) by mouth daily. (Patient not taking: Reported on 01/27/2021)    ? fluticasone (FLONASE) 50 MCG/ACT nasal spray Place 2 sprays into both nostrils daily. (Patient not taking: Reported on 01/27/2021)    ? hydrocortisone (ANUSOL-HC) 25 MG suppository Place 1 suppository (25 mg total) rectally 2 (two) times daily as needed for hemorrhoids. (Patient not taking: Reported on 01/27/2021) 12 suppository 0  ? metoCLOPramide (REGLAN) 10 MG tablet Take 1 tablet (10 mg total) by mouth 3 (three) times daily as needed for nausea. (Patient not taking: Reported on 03/01/2021) 20 tablet 0  ? polyethylene glycol-electrolytes (NULYTELY) 420 g solution  Prepare according to package instructions. Starting at 5:00 PM: Drink one 8 oz glass of mixture every 15 minutes until you finish half of the jug. Five hours prior to procedure, drink 8 oz glass of mixture every 15 minutes until it is all gone. Make sure you do not drink anything 4 hours prior to your procedure. 4000 mL 0  ? Sod Picosulfate-Mag Ox-Cit Acd (CLENPIQ) 10-3.5-12 MG-GM -GM/160ML SOLN Take 1 bottle at 5 PM followed by five 8 oz cups of water and repeat 5 hours before procedure. 320 mL 0  ? ?No facility-administered  medications prior to visit.  ?  ? ?Per HPI unless specifically indicated in ROS section below ?Review of Systems  ?Constitutional:  Negative for activity change, appetite change, chills, fatigue, fever and unexpected weight change.  ?HENT:  Negative for hearing loss.   ?Eyes:  Negative for visual disturbance.  ?Respiratory:  Negative for cough, chest tightness, shortness of breath and wheezing.   ?Cardiovascular:  Negative for chest pain, palpitations and leg swelling.  ?Gastrointestinal:  Positive for abdominal distention and abdominal pain. Negative for blood in stool, constipation, diarrhea, nausea and vomiting.  ?Genitourinary:  Negative for difficulty urinating and hematuria.  ?Musculoskeletal:  Negative for arthralgias, myalgias and neck pain.  ?Skin:  Negative for rash.  ?Neurological:  Positive for headaches (occ). Negative for dizziness, seizures and syncope.  ?Hematological:  Negative for adenopathy. Does not bruise/bleed easily.  ?Psychiatric/Behavioral:  Positive for dysphoric mood. The patient is not nervous/anxious.   ? ?Objective:  ?BP 114/72   Pulse 66   Temp 97.8 ?F (36.6 ?C) (Temporal)   Ht 5\' 4"  (1.626 m)   Wt 126 lb (57.2 kg)   SpO2 97%   BMI 21.63 kg/m?   ?Wt Readings from Last 3 Encounters:  ?04/19/21 126 lb (57.2 kg)  ?03/01/21 126 lb 4.1 oz (57.3 kg)  ?01/27/21 125 lb 3.2 oz (56.8 kg)  ?  ?  ?Physical Exam ?Vitals and nursing note reviewed.  ?Constitutional:   ?   Appearance: Normal appearance. She is not ill-appearing.  ?HENT:  ?   Head: Normocephalic and atraumatic.  ?   Right Ear: Tympanic membrane, ear canal and external ear normal. There is no impacted cerumen.  ?   Left Ear: Tympanic membrane, ear canal and external ear normal. There is no impacted cerumen.  ?Eyes:  ?   General:     ?   Right eye: No discharge.     ?   Left eye: No discharge.  ?   Extraocular Movements: Extraocular movements intact.  ?   Conjunctiva/sclera: Conjunctivae normal.  ?   Pupils: Pupils are equal,  round, and reactive to light.  ?Neck:  ?   Thyroid: No thyroid mass or thyromegaly.  ?Cardiovascular:  ?   Rate and Rhythm: Normal rate and regular rhythm.  ?   Pulses: Normal pulses.  ?   Heart sounds: Normal heart sounds. No murmur heard. ?Pulmonary:  ?   Effort: Pulmonary effort is normal. No respiratory distress.  ?   Breath sounds: Normal breath sounds. No wheezing, rhonchi or rales.  ?Abdominal:  ?   General: Bowel sounds are normal. There is no distension.  ?   Palpations: Abdomen is soft. There is no mass.  ?   Tenderness: There is no abdominal tenderness. There is no guarding or rebound.  ?   Hernia: No hernia is present.  ?Musculoskeletal:  ?   Cervical back: Normal range of motion and neck supple.  No rigidity.  ?   Right lower leg: No edema.  ?   Left lower leg: No edema.  ?Lymphadenopathy:  ?   Cervical: No cervical adenopathy.  ?Skin: ?   General: Skin is warm and dry.  ?   Findings: No rash.  ?Neurological:  ?   General: No focal deficit present.  ?   Mental Status: She is alert. Mental status is at baseline.  ?Psychiatric:     ?   Mood and Affect: Mood normal.     ?   Behavior: Behavior normal.  ? ?   ? ?Assessment & Plan:  ? ?Problem List Items Addressed This Visit   ? ? Encounter for general adult medical examination with abnormal findings - Primary (Chronic)  ?  Preventative protocols reviewed and updated unless pt declined. ?Discussed healthy diet and lifestyle.  ?  ?  ? Polycystic kidney disease  ?  Regularly sees nephrology ([place]).  ?  ?  ? Subclinical hyperthyroidism  ?  Update thyroid function in h/o this.  ?  ?  ? Relevant Orders  ? TSH  ? T4, free  ? T3  ? Epigastric pain  ?  Ongoing s/p extensive GI evaluation.  ??functional abd pain.  ?Pending second opinion through [facility] - awaiting scheduling.  ?Trial IBGuard.  ?Consider Effexor in place of sertraline.  ?  ?  ? Chronic kidney disease, stage 3a ([place])  ?  Appreciate renal care in h/o PCKD ?  ?  ? Relevant Orders  ? Lipid panel  ? VITAMIN D  25 Hydroxy (Vit-D Deficiency, Fractures)  ? Dyspepsia  ?  S/p extensive GI eval, pending second opinion at academic center. See above.  ?  ?  ? Moderate episode of recurrent major depressive disorder ([place])  ?  Stable period on cur

## 2021-04-19 NOTE — Assessment & Plan Note (Addendum)
Update thyroid function in h/o this.  ?

## 2021-04-19 NOTE — Assessment & Plan Note (Signed)
Regularly sees nephrology ([place]).  ?

## 2021-04-19 NOTE — Assessment & Plan Note (Addendum)
Ongoing symptoms. ?Low FODMAP diet not too helpful.  ?Suggested trial of IBGuard.  ?Consider Effexor.  ?

## 2021-04-19 NOTE — Patient Instructions (Addendum)
Trate pastillas de IBGard con comidas.  ?First shingles shot today. Return in 2-6 months for nurse visit for 2nd shot.  ?Labs today  ?Considere medicina effexor (venlafaxine) para estomago y calores de [place].  ?Gusto verla hoy. ?Regresar en 1 a?o para proximo examen fisico.  ? ?Mantenimiento de [employer] mujeres ?Health Maintenance, Female ?Adoptar un estilo de vida saludable y recibir atenci?n preventiva son importantes para promover la salud y Counsellor. Consulte al m?dico sobre: ?El esquema adecuado para [place] pruebas y ex?menes peri?dicos. ?Cosas que puede hacer por su cuenta para prevenir enfermedades y [place] sano. ??Qu? debo saber sobre la dieta, el peso y el ejercicio? ?Consuma una dieta saludable ? ?Consuma una dieta que incluya muchas verduras, frutas, productos l?cteos con bajo contenido de grasa y prote?nas magras. ?No consuma muchos alimentos ricos en grasas s?lidas, az?cares agregados o sodio. ?Mantenga un peso saludable ?El ?ndice de masa muscular [facility]) se [place] para identificar problemas de peso. Proporciona una estimaci?n de la grasa corporal bas?ndose en el peso y la altura. Su m?dico puede ayudarle a Engineer, site IMC y a Personnel officer o Pharmacologist un peso saludable. ?Haga ejercicio con regularidad ?Haga ejercicio con regularidad. Esta es una de las pr?cticas m?s importantes que puede hacer por su salud. La mayor?a de los adultos deben seguir estas pautas: ?Realizar, al menos, 150 minutos de [place] f?sica por semana. El ejercicio debe aumentar la frecuencia card?aca y Media planner transpirar (ejercicio de intensidad moderada). ?Hacer ejercicios de fortalecimiento por lo [employer] por semana. Agregue esto a su plan de ejercicio de intensidad moderada. ?Pase menos tiempo sentada. Incluso la actividad f?sica ligera puede ser beneficiosa. ?Controle sus niveles de colesterol y l?pidos en la sangre ?Comience a realizarse an?lisis de l?pidos y Oncologist en la sangre a los 20 a?os y luego  rep?talos cada 5 a?os. ?H?gase controlar los niveles de colesterol con mayor frecuencia si: ?Sus niveles de l?pidos y colesterol son altos. ?Es mayor de 40 a?os. ?Presenta un alto riesgo de padecer enfermedades card?acas. ??Qu? debo saber sobre las pruebas de detecci?n del c?ncer? ?Seg?n su historia cl?nica y sus antecedentes familiares, es posible que deba realizarse pruebas de detecci?n del c?ncer en diferentes edades. Esto puede incluir pruebas de detecci?n de lo siguiente: ?C?ncer de mama. ?C?ncer de cuello uterino. ?C?ncer colorrectal. ?C?ncer de piel. ?C?ncer de pulm?n. ??Qu? debo saber sobre la enfermedad card?aca, la diabetes y la hipertensi?n arterial? ?Presi?n arterial y enfermedad card?aca ?La hipertensi?n arterial causa enfermedades card?acas y [place] el riesgo de accidente cerebrovascular. Es m?s probable que Immunologist en las personas que tienen lecturas de presi?n arterial alta o tienen sobrepeso. ?H?gase controlar la presi?n arterial: ?Cada 3 a 5 a?os si tiene entre 18 y 97 a?os. ?Todos los a?os si es mayor de 40 a?os. ?Diabetes ?Real?cese ex?menes de detecci?n de la diabetes con regularidad. Este an?lisis revisa el nivel de az?car en la sangre en [place]. H?gase las pruebas de detecci?n: ?Cada tres a?os despu?s de los 40 a?os de edad si tiene un peso normal y un bajo riesgo de padecer diabetes. ?Con m?s frecuencia y a partir de [place] edad inferior si tiene sobrepeso o un alto riesgo de padecer diabetes. ??Qu? debo saber sobre la prevenci?n de infecciones? ?Hepatitis B ?Si tiene un riesgo m?s alto de Primary school teacher hepatitis B, debe someterse a un examen de detecci?n de este virus. Hable con el m?dico para averiguar si tiene riesgo de contraer la infecci?n por hepatitis B. ?Hepatitis C ?Se recomienda el  an?lisis a: ?[employer] 1946 y 1965. ?Todas las personas que tengan un riesgo de haber contra?do hepatitis C. ?Enfermedades de transmisi?n sexual (ETS) ?H?gase las pruebas de  detecci?n de ITS, incluidas la gonorrea y la clamidia, si: ?Es sexualmente activa y es menor de 24 a?os. ?Es mayor de 24 a?os, y el m?dico le informa que corre riesgo de tener este tipo de infecciones. ?La actividad sexual ha cambiado desde que le hicieron la ?ltima prueba de detecci?n y tiene un riesgo mayor de tener clamidia o [place]. Preg?ntele al m?dico si usted tiene riesgo. ?Preg?ntele al m?dico si usted tiene un alto riesgo de Primary school teacher VIH. El m?dico tambi?n puede recomendarle un medicamento recetado para ayudar a evitar la infecci?n por el VIH. Si elige tomar medicamentos para prevenir el VIH, primero debe [employer] an?lisis de detecci?n del VIH. Luego debe hacerse an?lisis cada 3 meses mientras est? tomando los [employer]. ?Embarazo ?Si est? por dejar de Armed forces training and education officer (fase premenop?usica) y usted puede quedar [place], busque asesoramiento antes de [place]. ?[place] de 400 a 800 microgramos (mcg) de ?cido f?lico todos los d?as si queda embarazada. ?Pida m?todos de control de la natalidad (anticonceptivos) si desea evitar un embarazo no deseado. ?Osteoporosis y menopausia ?La osteoporosis es una enfermedad en la que los huesos pierden los minerales y la fuerza por el avance de la edad. El resultado pueden ser fracturas en los [place]. Si tiene 65 a?os o m?s, o si est? en riesgo de sufrir osteoporosis y fracturas, pregunte a su m?dico si debe: ?Hacerse pruebas de detecci?n de p?rdida ?sea. ?Tomar un suplemento de calcio o de vitamina D para reducir el riesgo de fracturas. ?Recibir terapia de reemplazo hormonal (TRH) para tratar los s?ntomas de la menopausia. ?Siga estas indicaciones en su casa: ?Consumo de alcohol ?No beba alcohol si: ?Su m?dico le indica no hacerlo. ?Est? embarazada, puede estar embarazada o est? tratando de quedar embarazada. ?Si bebe alcohol: ?Limite la cantidad que bebe a lo siguiente: ?De 0 a 1 bebida por d?a. ?Sepa cu?nta cantidad de alcohol hay en las bebidas que toma. En  los [address], una medida equivale a una botella de cerveza de 12 oz (355 ml), un vaso de vino de 5 oz (148 ml) o un vaso de una bebida alcoh?lica de alta graduaci?n de 1? oz (44 ml). ?Estilo de vida ?No consuma ning?n producto que contenga nicotina o tabaco. Estos productos incluyen cigarrillos, tabaco para mascar y aparatos de vapeo, como los cigarrillos electr?nicos. Si necesita ayuda para dejar de consumir estos productos, consulte al m?dico. ?No consuma drogas. ?No comparta agujas. ?Solicite ayuda a su m?dico si necesita apoyo o informaci?n para abandonar las drogas. ?Indicaciones generales ?Real?cese los estudios de rutina de [address], dentales y de Wellsite geologist. ?Mant?ngase al d?a con las vacunas. ?Inf?rmele a su m?dico si: ?Se siente deprimida con frecuencia. ?Alguna vez ha sido v?ctima de Medical sales representative o no se siente seguro en su casa. ?Resumen ?Adoptar un estilo de vida saludable y recibir atenci?n preventiva son importantes para promover la salud y Counsellor. ?Siga las instrucciones del m?dico acerca de una dieta saludable, el ejercicio y la realizaci?n de pruebas o ex?menes para Hotel manager. ?Siga las instrucciones del m?dico con respecto al control del colesterol y la presi?n arterial. ?Esta informaci?n no tiene Theme park manager el consejo del m?dico. Aseg?rese de hacerle al m?dico cualquier pregunta que tenga. ?Document Revised: 06/10/2020 Document Reviewed: 06/10/2020 ?Elsevier Patient Education ? 2022 Elsevier Inc. ? ?

## 2021-04-20 LAB — T3: T3, Total: 117 ng/dL (ref 76–181)

## 2021-05-18 ENCOUNTER — Encounter: Payer: Self-pay | Admitting: Family Medicine

## 2021-05-18 DIAGNOSIS — R911 Solitary pulmonary nodule: Secondary | ICD-10-CM

## 2021-05-18 DIAGNOSIS — U099 Post covid-19 condition, unspecified: Secondary | ICD-10-CM

## 2021-05-18 DIAGNOSIS — R079 Chest pain, unspecified: Secondary | ICD-10-CM

## 2021-05-19 ENCOUNTER — Telehealth (INDEPENDENT_AMBULATORY_CARE_PROVIDER_SITE_OTHER): Payer: PRIVATE HEALTH INSURANCE | Admitting: Family Medicine

## 2021-05-19 ENCOUNTER — Encounter: Payer: Self-pay | Admitting: Family Medicine

## 2021-05-19 VITALS — BP 115/77 | HR 61 | Temp 98.4°F | Ht 64.0 in

## 2021-05-19 DIAGNOSIS — U071 COVID-19: Secondary | ICD-10-CM | POA: Diagnosis not present

## 2021-05-19 MED ORDER — GUAIFENESIN-CODEINE 100-10 MG/5ML PO SYRP: 5.0000 mL | ORAL_SOLUTION | Freq: Three times a day (TID) | ORAL | 0 refills | Status: DC | PRN

## 2021-05-19 MED ORDER — NIRMATRELVIR/RITONAVIR (PAXLOVID)TABLET: 3.0000 | ORAL_TABLET | Freq: Two times a day (BID) | ORAL | 0 refills | Status: AC

## 2021-05-19 NOTE — Progress Notes (Signed)
? ? ? ?  Diane Hood T. Diane Kurtzman, MD ?Primary Care and Sports Medicine ?Therapist, music at [facility] ?[address] ?[place], [address] ?Phone: [phone]  FAX: [phone] ? ?Diane Hood - 51 y.o. female  MRN [id number]  Date of Birth: 1970-07-01 ? ?Visit Date: 05/19/2021  PCP: Ria Bush, MD  Referred by: Ria Bush, MD ? ?Virtual Visit via Video Note: ? ?I connected with  Diane Hood on 05/19/2021  9:40 AM EDT by a video enabled telemedicine application and verified that I am speaking with the correct person using two identifiers. ?  ?Location patient: home computer, tablet, or smartphone ?Location provider: work or home office ?Consent: Verbal consent directly obtained from Diane Hood. ?Persons participating in the virtual visit: patient, provider ? ?I discussed the limitations of evaluation and management by telemedicine and the availability of in person appointments. The patient expressed understanding and agreed to proceed. ? ?Chief Complaint  ?Patient presents with  ? Covid Positive  ?  Positive Home Test Yesterday  ?Symtoms started Saturday night  ? Cough  ? Generalized Body Aches  ? Fever  ? Fatigue  ? ? ?History of Present Illness: ? ?Feeling a little bit better, but she is coughing a lot.  Hurting all over and no energy.  Symptoms as above, generalized body ache, sore throat, low-grade fever, fatigue, significant cough that is keeping her up at night. ? ?Tylenol and Robitussin.  ? ?Not working now, at home.  ? ? ?Review of Systems as above: See pertinent positives and pertinent negatives per HPI ?No acute distress verbally ?  ?Observations/Objective/Exam: ? ?An attempt was made to discern vital signs over the phone and per patient if applicable and possible.  ? ?General: ?   Alert, Oriented, appears well and in no acute distress ? ?Pulmonary:  ?   On inspection no signs of respiratory distress. ? ?Psych / Neurological:  ?   Pleasant and  cooperative. ? ?Assessment and Plan: ? ?  ICD-10-CM   ?1. COVID-19  U07.1 nirmatrelvir/ritonavir EUA (PAXLOVID) 20 x 150 MG & 10 x 100MG  TABS  ?  guaiFENesin-codeine (ROBITUSSIN AC) 100-10 MG/5ML syrup  ?  ? ?Continue with supportive care, Cheratussin AC as needed at nighttime, add Paxlovid. ? ?I discussed the assessment and treatment plan with the patient. The patient was provided an opportunity to ask questions and all were answered. The patient agreed with the plan and demonstrated an understanding of the instructions. ?  ?The patient was advised to call back or seek an in-person evaluation if the symptoms worsen or if the condition fails to improve as anticipated. ? ?Follow-up: prn unless noted otherwise below ?No follow-ups on file. ? ?Meds ordered this encounter  ?Medications  ? nirmatrelvir/ritonavir EUA (PAXLOVID) 20 x 150 MG & 10 x 100MG  TABS  ?  Sig: Take 3 tablets by mouth 2 (two) times daily for 5 days. (Take nirmatrelvir 150 mg two tablets twice daily for 5 days and ritonavir 100 mg one tablet twice daily for 5 days) Patient GFR is 60  ?  Dispense:  30 tablet  ?  Refill:  0  ? guaiFENesin-codeine (ROBITUSSIN AC) 100-10 MG/5ML syrup  ?  Sig: Take 5 mLs by mouth 3 (three) times daily as needed for cough.  ?  Dispense:  120 mL  ?  Refill:  0  ? ?No orders of the defined types were placed in this encounter. ? ? ?Signed, ? ?Kami Kube T. Evany Schecter, MD ? ?

## 2021-05-30 NOTE — Telephone Encounter (Signed)
Spoke to patient by telephone and scheduled an appointment tomorrow 05/31/21 at 11:30 am with Dr. Sharen Hones. Patient was given ER precautions and she verbalized understanding. ?

## 2021-05-30 NOTE — Telephone Encounter (Signed)
Please schedule OV this week for ongoing chest pain and cough 10+ days after COVID.  ?

## 2021-05-31 ENCOUNTER — Encounter: Payer: Self-pay | Admitting: Family Medicine

## 2021-05-31 ENCOUNTER — Ambulatory Visit: Payer: PRIVATE HEALTH INSURANCE | Admitting: Family Medicine

## 2021-05-31 VITALS — BP 108/78 | HR 78 | Temp 97.4°F | Ht 64.0 in | Wt 128.0 lb

## 2021-05-31 DIAGNOSIS — R490 Dysphonia: Secondary | ICD-10-CM

## 2021-05-31 DIAGNOSIS — R0609 Other forms of dyspnea: Secondary | ICD-10-CM | POA: Diagnosis not present

## 2021-05-31 DIAGNOSIS — U099 Post covid-19 condition, unspecified: Secondary | ICD-10-CM | POA: Diagnosis not present

## 2021-05-31 DIAGNOSIS — Q613 Polycystic kidney, unspecified: Secondary | ICD-10-CM | POA: Diagnosis not present

## 2021-05-31 DIAGNOSIS — R079 Chest pain, unspecified: Secondary | ICD-10-CM

## 2021-05-31 DIAGNOSIS — R058 Other specified cough: Secondary | ICD-10-CM

## 2021-05-31 MED ORDER — HYDROCOD POLI-CHLORPHE POLI ER 10-8 MG/5ML PO SUER: 5.0000 mL | Freq: Two times a day (BID) | ORAL | 0 refills | Status: DC | PRN

## 2021-05-31 MED ORDER — ALBUTEROL SULFATE HFA 108 (90 BASE) MCG/ACT IN AERS: 2.0000 | INHALATION_SPRAY | Freq: Four times a day (QID) | RESPIRATORY_TRACT | 0 refills | Status: DC | PRN

## 2021-05-31 MED ORDER — PREDNISONE 20 MG PO TABS: ORAL_TABLET | ORAL | 0 refills | Status: DC

## 2021-05-31 NOTE — Patient Instructions (Addendum)
EKG today  ?Creo que tiene inflamacion residua en los pulmones despues del virus de COVID.  ?Tome [employer] he mandado a su farmacia, use jarabe mas fuerte para la tos, puede usar albuterol [employer]. ?Dejeme saber si no mejora con esto para revisar laboratorios y Engineer, maintenance (IT).  ?

## 2021-05-31 NOTE — Assessment & Plan Note (Addendum)
Presenting with ongoing dry cough, chest soreness, and exertional shortness of breath. Clear lungs, good vitals including O2 sat points against PE or PNA. Anticipate ongoing post-COVID pulmonary inflammation.  ?Rx prednisone taper, albuterol inh PRN, tussionex cough syrup PRN.  ?Update if not improving, discussed possible CXR, further evaluation with labwork. Pt agrees with plan.  ?Advised continuing to hold estrogen as well.  ?

## 2021-05-31 NOTE — Progress Notes (Signed)
? ? Patient ID: Diane Hood, female    DOB: 1970/09/16, 51 y.o.   MRN: [id number] ? ?This visit was conducted in person. ? ?BP 108/78   Pulse 78   Temp (!) 97.4 ?F (36.3 ?C) (Temporal)   Ht 5\' 4"  (1.626 m)   Wt 128 lb (58.1 kg)   SpO2 99%   BMI 21.97 kg/m?   ? ?CC: residual cough and chest pain after COVID  ?Subjective:  ? ?HPI: ?Diane Hood is a 51 y.o. female presenting on 05/31/2021 for Cough (C/o ongoing cough and chest pain post COVID- 05/15/21.) ? ? ?COVID infection symptoms started 05/15/2021. Tested positive 05/18/2021. Saw Dr 07/18/2021 virtually, treated with Paxlovid antiviral as well as cheratussin codeine cough syrup, tolerated medication well.  ? ?Tested negative last week.  ? ?Notes ongoing hoarse voice, dry cough worse at night and in am (wakes her up), substernal deep chest pain described as sore ache, worse with cough. Occasional dyspnea most noticeable with exertion. Occasional ST, HA.  ? ?No further fever, ear pain, sinus congestion.  ? ?She just recently restarted walking, notes decreased execise capacity. Still no yoga as she still feels unsteady.  ? ?She's stopped estrogen since COVID.  ?She also stopped sertraline (slowly tapered) since COVID.  ? ?She did receive Pfizer COVID vaccine x2, booster x1.  ? ?She received first shingrix vaccine - felt very bad for 2 days after this with diffuse body aches.  ?   ? ?Relevant past medical, surgical, family and social history reviewed and updated as indicated. Interim medical history since our last visit reviewed. ?Allergies and medications reviewed and updated. ?Outpatient Medications Prior to Visit  ?Medication Sig Dispense Refill  ? Cholecalciferol 125 MCG (5000 UT) TABS     ? estradiol (ESTRACE) 1 MG tablet Take 1 tablet (1 mg total) by mouth daily. 90 tablet 3  ? loratadine (CLARITIN) 10 MG tablet Take 10 mg by mouth daily.    ? sertraline (ZOLOFT) 50 MG tablet Take 1.5 tablets (75 mg total) by mouth daily. 135 tablet 3  ? valACYclovir  (VALTREX) 500 MG tablet TAKE 2 TABLETS BY MOUTH AS NEEDED 20 tablet 1  ? guaiFENesin-codeine (ROBITUSSIN AC) 100-10 MG/5ML syrup Take 5 mLs by mouth 3 (three) times daily as needed for cough. 120 mL 0  ? ?No facility-administered medications prior to visit.  ?  ? ?Per HPI unless specifically indicated in ROS section below ?Review of Systems ? ?Objective:  ?BP 108/78   Pulse 78   Temp (!) 97.4 ?F (36.3 ?C) (Temporal)   Ht 5\' 4"  (1.626 m)   Wt 128 lb (58.1 kg)   SpO2 99%   BMI 21.97 kg/m?   ?Wt Readings from Last 3 Encounters:  ?05/31/21 128 lb (58.1 kg)  ?04/19/21 126 lb (57.2 kg)  ?03/01/21 126 lb 4.1 oz (57.3 kg)  ?  ?  ?Physical Exam ?Vitals and nursing note reviewed.  ?Constitutional:   ?   Appearance: Normal appearance. She is not ill-appearing.  ?HENT:  ?   Head: Normocephalic and atraumatic.  ?   Right Ear: Tympanic membrane, ear canal and external ear normal. There is no impacted cerumen.  ?   Left Ear: Tympanic membrane, ear canal and external ear normal. There is no impacted cerumen.  ?   Nose: Nose normal.  ?   Mouth/Throat:  ?   Mouth: Mucous membranes are moist.  ?   Pharynx: Oropharynx is clear. No oropharyngeal exudate or posterior oropharyngeal erythema.  ?Eyes:  ?  Extraocular Movements: Extraocular movements intact.  ?   Conjunctiva/sclera: Conjunctivae normal.  ?   Pupils: Pupils are equal, round, and reactive to light.  ?Neck:  ?   Thyroid: No thyroid mass or thyromegaly.  ?Cardiovascular:  ?   Rate and Rhythm: Normal rate and regular rhythm.  ?   Pulses: Normal pulses.  ?   Heart sounds: Normal heart sounds. No murmur heard. ?Pulmonary:  ?   Effort: Pulmonary effort is normal. No respiratory distress.  ?   Breath sounds: Normal breath sounds. No wheezing, rhonchi or rales.  ?Chest:  ?   Chest wall: Tenderness (mild discomfort to palpation at L 2nd costochondral junction) present.  ?Musculoskeletal:  ?   Cervical back: Normal range of motion and neck supple.  ?   Right lower leg: No edema.   ?   Left lower leg: No edema.  ?Lymphadenopathy:  ?   Cervical: No cervical adenopathy.  ?Skin: ?   General: Skin is warm and dry.  ?   Findings: No rash.  ?Neurological:  ?   Mental Status: She is alert.  ?Psychiatric:     ?   Mood and Affect: Mood normal.     ?   Behavior: Behavior normal.  ? ?   ?Results for orders placed or performed in visit on 04/19/21  ?Lipid panel  ?Result Value Ref Range  ? Cholesterol 216 (H) 0 - 200 mg/dL  ? Triglycerides 66.0 0.0 - 149.0 mg/dL  ? HDL 61.50 >39.00 mg/dL  ? VLDL 13.2 0.0 - 40.0 mg/dL  ? LDL Cholesterol 141 (H) 0 - 99 mg/dL  ? Total CHOL/HDL Ratio 4   ? NonHDL 154.03   ?TSH  ?Result Value Ref Range  ? TSH 0.46 0.35 - 5.50 uIU/mL  ?T4, free  ?Result Value Ref Range  ? Free T4 0.81 0.60 - 1.60 ng/dL  ?T3  ?Result Value Ref Range  ? T3, Total 117 76 - 181 ng/dL  ?VITAMIN D 25 Hydroxy (Vit-D Deficiency, Fractures)  ?Result Value Ref Range  ? VITD 76.20 30.00 - 100.00 ng/mL  ? ?Lab Results  ?Component Value Date  ? CREATININE 1.00 05/26/2020  ? BUN 21 05/26/2020  ? NA 142 05/26/2020  ? K 3.7 05/26/2020  ? CL 106 05/26/2020  ? CO2 29 05/26/2020  ?  ?EKG - NSR rate 70, normal axis, intervals, no hypertrophy or acute St/T changes. RSr V1.  ? ?Assessment & Plan:  ? ?Problem List Items Addressed This Visit   ? ? Polycystic kidney disease  ? 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  Presenting with ongoing dry cough, chest soreness, and exertional shortness of breath. Clear lungs, good vitals including O2 sat points against PE or PNA. Anticipate ongoing post-COVID pulmonary inflammation.  ?Rx prednisone taper, albuterol inh PRN, tussionex cough syrup PRN.  ?Update if not improving, discussed possible CXR, further evaluation with labwork. Pt agrees with plan.  ?Advised continuing to hold estrogen as well.  ? ?  ?  ? ?Other Visit Diagnoses   ? ? Chest pain, unspecified type      ? Relevant Orders  ? EKG 12-Lead (Completed)  ? Exertional dyspnea      ? Hoarseness      ? Dry  cough      ? ?  ?  ? ?Meds ordered this encounter  ?Medications  ? predniSONE (DELTASONE) 20 MG tablet  ?  Sig: Take two tablets daily for 3 days followed by one  tablet daily for 4 days  ?  Dispense:  10 tablet  ?  Refill:  0  ? chlorpheniramine-HYDROcodone (TUSSIONEX PENNKINETIC ER) 10-8 MG/5ML  ?  Sig: Take 5 mLs by mouth every 12 (twelve) hours as needed for cough.  ?  Dispense:  115 mL  ?  Refill:  0  ? albuterol (VENTOLIN HFA) 108 (90 Base) MCG/ACT inhaler  ?  Sig: Inhale 2 puffs into the lungs every 6 (six) hours as needed for wheezing or shortness of breath.  ?  Dispense:  8 g  ?  Refill:  0  ? ?Orders Placed This Encounter  ?Procedures  ? EKG 12-Lead  ? ? ? ?Patient Instructions  ?EKG today  ?Creo que tiene inflamacion residua en los pulmones despues del virus de COVID.  ?Tome Schering-Ploughprednisona que he mandado a su farmacia, use jarabe mas fuerte para la tos, puede usar albuterol Avnetinhalador tambien. ?Dejeme saber si no mejora con esto para revisar laboratorios y Engineer, maintenance (IT)placa toracica.  ? ?Follow up plan: ?Return if symptoms worsen or fail to improve. ? ?Eustaquio BoydenJavier Jdyn Parkerson, MD   ?

## 2021-06-06 NOTE — Addendum Note (Signed)
Addended by: Ria Bush on: 06/06/2021 08:56 AM   Modules accepted: Orders

## 2021-06-06 NOTE — Telephone Encounter (Signed)
Please schedule (nonfasting) lab visit and CXR for patient

## 2021-06-07 ENCOUNTER — Encounter: Payer: Self-pay | Admitting: Family Medicine

## 2021-06-07 ENCOUNTER — Other Ambulatory Visit: Payer: Self-pay | Admitting: Family Medicine

## 2021-06-07 ENCOUNTER — Ambulatory Visit (INDEPENDENT_AMBULATORY_CARE_PROVIDER_SITE_OTHER)
Admission: RE | Admit: 2021-06-07 | Discharge: 2021-06-07 | Disposition: A | Discharge status: Home / Self Care | Payer: PRIVATE HEALTH INSURANCE | Source: Ambulatory Visit | Attending: Family Medicine | Admitting: Family Medicine

## 2021-06-07 ENCOUNTER — Other Ambulatory Visit (INDEPENDENT_AMBULATORY_CARE_PROVIDER_SITE_OTHER): Payer: PRIVATE HEALTH INSURANCE

## 2021-06-07 DIAGNOSIS — R911 Solitary pulmonary nodule: Secondary | ICD-10-CM

## 2021-06-07 DIAGNOSIS — R079 Chest pain, unspecified: Secondary | ICD-10-CM

## 2021-06-07 DIAGNOSIS — U099 Post covid-19 condition, unspecified: Secondary | ICD-10-CM | POA: Diagnosis not present

## 2021-06-07 DIAGNOSIS — R0609 Other forms of dyspnea: Secondary | ICD-10-CM

## 2021-06-07 LAB — CBC WITH DIFFERENTIAL/PLATELET
Basophils Absolute: 0.1 10*3/uL (ref 0.0–0.1)
Basophils Relative: 0.5 % (ref 0.0–3.0)
Eosinophils Absolute: 0.2 10*3/uL (ref 0.0–0.7)
Eosinophils Relative: 1.4 % (ref 0.0–5.0)
HCT: 36.3 % (ref 36.0–46.0)
Hemoglobin: 12 g/dL (ref 12.0–15.0)
Lymphocytes Relative: 44.7 % (ref 12.0–46.0)
Lymphs Abs: 5.2 10*3/uL — ABNORMAL HIGH (ref 0.7–4.0)
MCHC: 33.1 g/dL (ref 30.0–36.0)
MCV: 87.7 fl (ref 78.0–100.0)
Monocytes Absolute: 0.6 10*3/uL (ref 0.1–1.0)
Monocytes Relative: 4.9 % (ref 3.0–12.0)
Neutro Abs: 5.7 10*3/uL (ref 1.4–7.7)
Neutrophils Relative %: 48.5 % (ref 43.0–77.0)
Platelets: 381 10*3/uL (ref 150.0–400.0)
RBC: 4.15 Mil/uL (ref 3.87–5.11)
RDW: 13.8 % (ref 11.5–15.5)
WBC: 11.7 10*3/uL — ABNORMAL HIGH (ref 4.0–10.5)

## 2021-06-07 LAB — TROPONIN I (HIGH SENSITIVITY): High Sens Troponin I: 4 ng/L (ref 2–17)

## 2021-06-07 LAB — BASIC METABOLIC PANEL
BUN: 15 mg/dL (ref 6–23)
CO2: 28 mEq/L (ref 19–32)
Calcium: 9.3 mg/dL (ref 8.4–10.5)
Chloride: 107 mEq/L (ref 96–112)
Creatinine, Ser: 0.98 mg/dL (ref 0.40–1.20)
GFR: 66.7 mL/min (ref >60.00)
Glucose, Bld: 92 mg/dL (ref 70–99)
Potassium: 3.9 mEq/L (ref 3.5–5.1)
Sodium: 142 mEq/L (ref 135–145)

## 2021-06-07 LAB — BRAIN NATRIURETIC PEPTIDE: Pro B Natriuretic peptide (BNP): 50 pg/mL (ref 0.0–100.0)

## 2021-06-16 ENCOUNTER — Telehealth: Payer: Self-pay | Admitting: Family Medicine

## 2021-06-16 NOTE — Telephone Encounter (Signed)
Pt called and stated she was suppose to get a phone call within 10 days  to get her set up her test the patient never received a call please call her

## 2021-06-16 NOTE — Telephone Encounter (Signed)
Spoke with pt to get clarification of message.  States Dr. Reece Agar told her she should get a call to schedule further imaging of nodule in chest.  Pt was concerned because she had not heard anything yet.    Looks like Dr. Reece Agar ordered a chest CT.  Plz update pt on status of order.

## 2021-06-17 ENCOUNTER — Ambulatory Visit
Admission: RE | Admit: 2021-06-17 | Discharge: 2021-06-17 | Disposition: A | Discharge status: Home / Self Care | Payer: PRIVATE HEALTH INSURANCE | Source: Ambulatory Visit | Attending: Family Medicine | Admitting: Family Medicine

## 2021-06-17 DIAGNOSIS — R911 Solitary pulmonary nodule: Secondary | ICD-10-CM | POA: Diagnosis not present

## 2021-06-17 NOTE — Telephone Encounter (Signed)
Appt is scheduled for 06/17/21 at 1:30 (today) at [place] was scheduled with the patient  This is approved by insurance.   I called pt, LM to get clarification of what she is needing - there seems to be confusion.   LM for call back

## 2021-06-17 NOTE — Telephone Encounter (Signed)
Noted  

## 2021-06-22 ENCOUNTER — Other Ambulatory Visit: Payer: Self-pay | Admitting: Family Medicine

## 2021-06-22 ENCOUNTER — Ambulatory Visit: Payer: PRIVATE HEALTH INSURANCE

## 2021-06-28 IMAGING — CT CT ABD-PELV W/ CM
2 of 5 series · 15 of 46 positions shown, 17 images · IV contrast (APPLIED)
Comparison: Renal ultrasound 01/29/2020

CLINICAL DATA: Acute abdominal pain. Abdominal distension and
bloating.

EXAM:
CT ABDOMEN AND PELVIS WITH CONTRAST
TECHNIQUE: Multidetector CT imaging of the abdomen and pelvis was performed
using the standard protocol following bolus administration of
intravenous contrast.
CONTRAST:  100mL OMNIPAQUE IOHEXOL 300 MG/ML  SOLN

[Series 2: routine abd/pel with · axial · 0.77mm/px · z∈[+354,+759]mm · 12 of 93 slices shown, 14 images]
[im 6/93  soft-tissue]
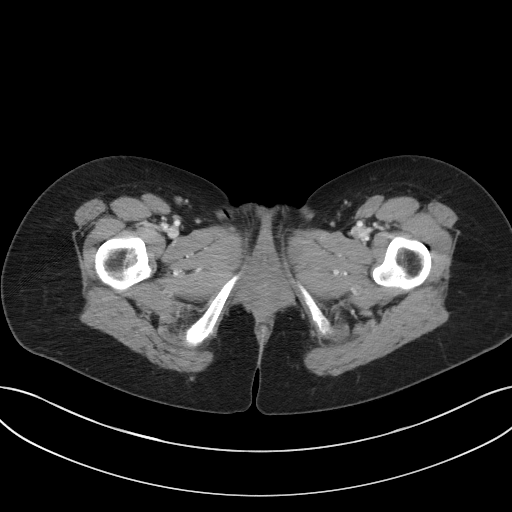
[im 6/93  bone]
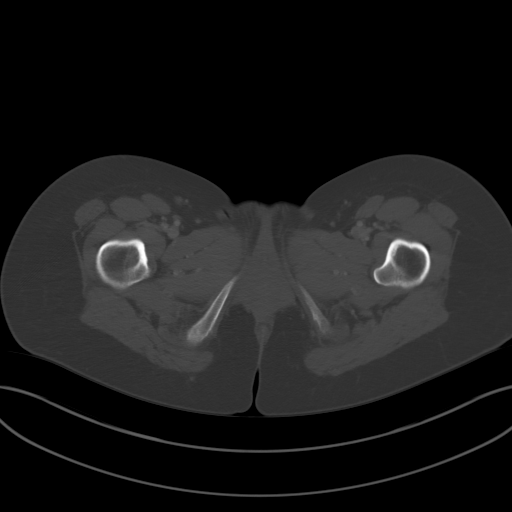
[im 16/93  soft-tissue]
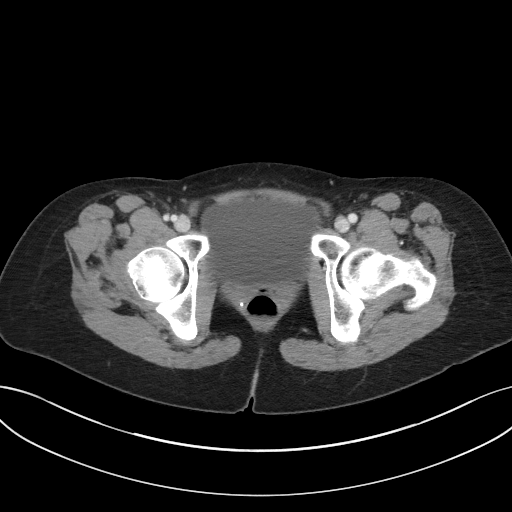
[im 21/93  soft-tissue]
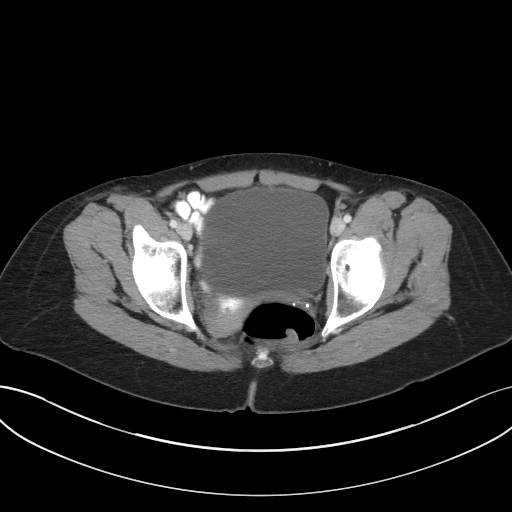
[im 26/93  soft-tissue]
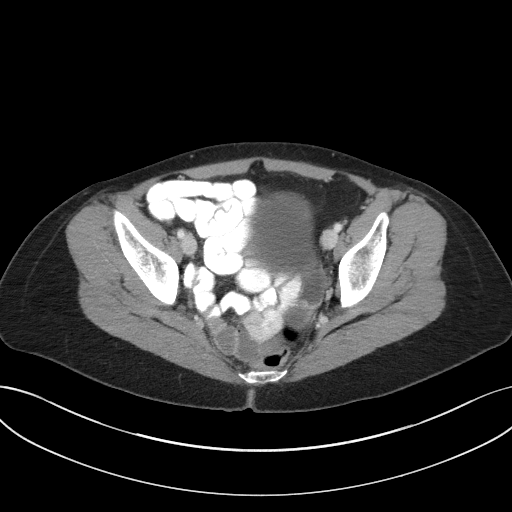
[im 36/93  soft-tissue]
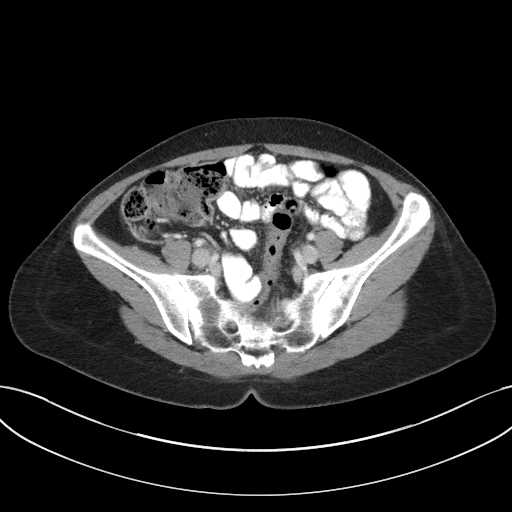
[im 41/93  soft-tissue]
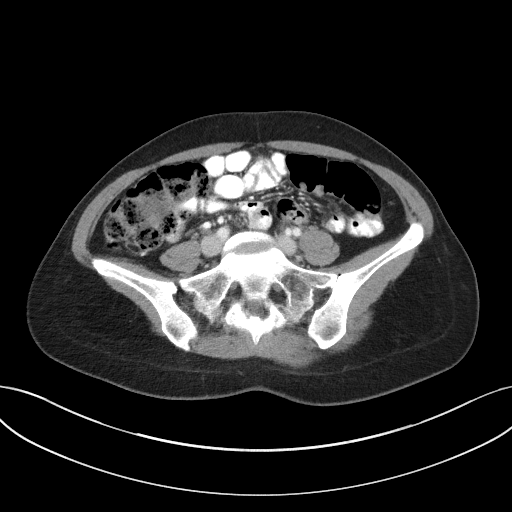
[im 52/93  soft-tissue]
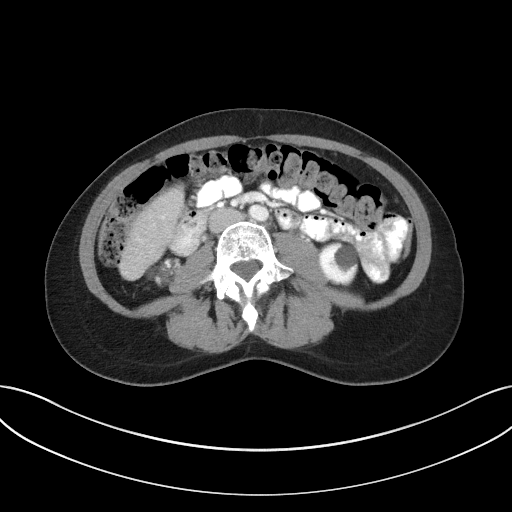
[im 57/93  soft-tissue]
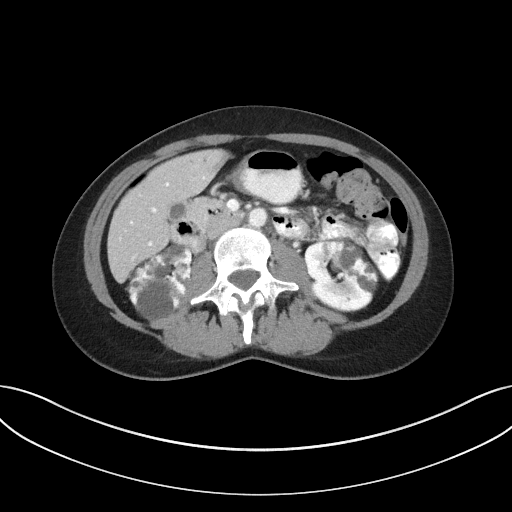
[im 67/93  soft-tissue]
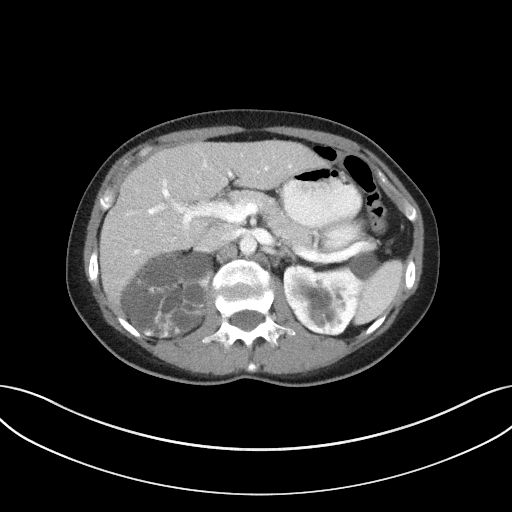
[im 67/93  bone]
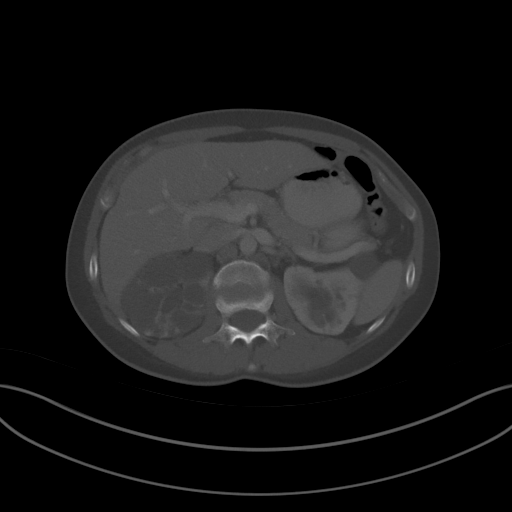
[im 72/93  soft-tissue]
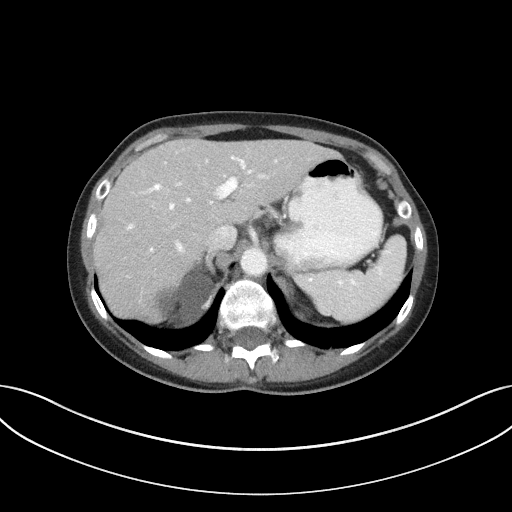
[im 77/93  soft-tissue]
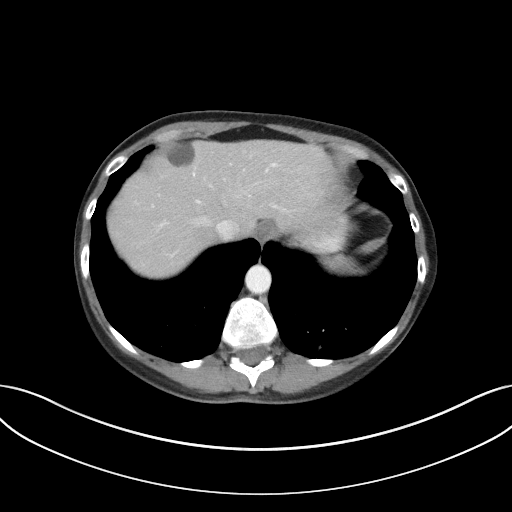
[im 87/93  soft-tissue]
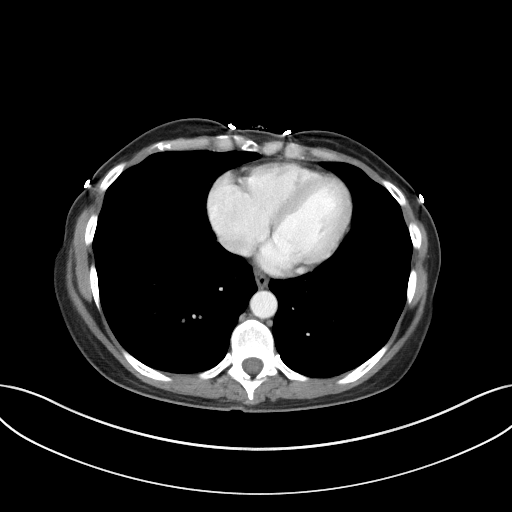

[Series 5: coronal st · coronal · 0.82mm/px · 3 of 81 slices shown]
[im 27/81  soft-tissue]
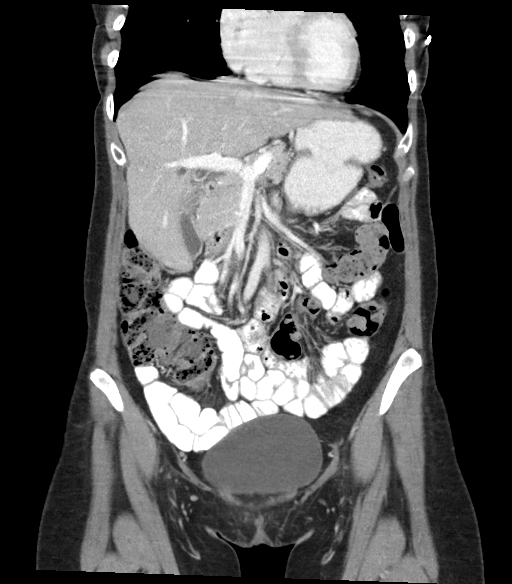
[im 36/81  soft-tissue]
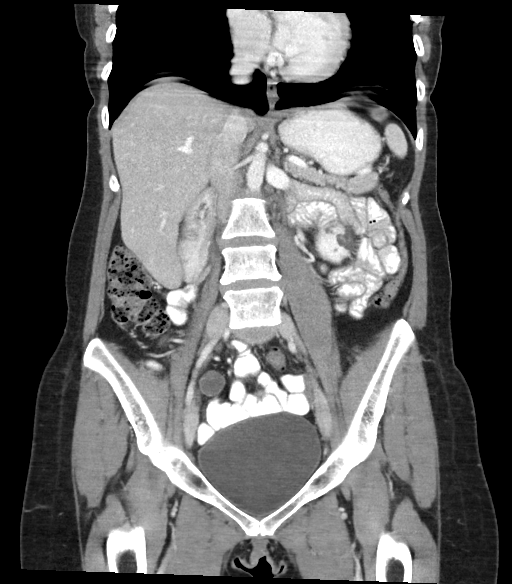
[im 45/81  soft-tissue]
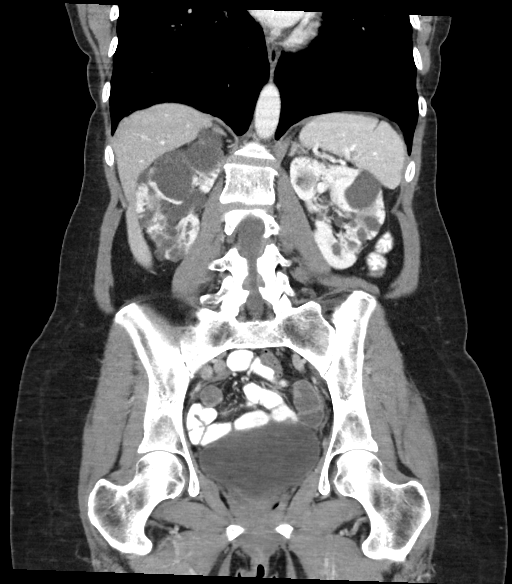

[15 of 46 positions shown; findings below may reference images not displayed]

FINDINGS: Lower chest: Linear atelectasis in the left lower lobe, with slight
nodularity, including a 6 mm lower lobe nodule, series 4, image 16.

Hepatobiliary: Hepatic cysts (less than 10 cysts total), largest
anteriorly measuring 2.5 cm. No solid or suspicious hepatic lesion.
Mild hepatic steatosis. Unremarkable gallbladder. No calcified
gallstone or pericholecystic inflammation

Pancreas: No ductal dilatation or inflammation. No evidence of
pancreatic cyst or mass.

Spleen: Normal in size without focal abnormality.

Adrenals/Urinary Tract: Normal adrenal glands. Polycystic appearance
of the kidneys without hydronephrosis. There is greater involvement
of the right kidney than left, with mild delayed right renal
excretion. No stone or obstructing lesion. Unremarkable urinary
bladder.

Stomach/Bowel: Normal appearance of the stomach and duodenum.
Administered enteric contrast reaches the distal small bowel. No
small bowel obstruction or inflammation. The appendix is not
definitively visualized, there is no appendicitis. Moderate stool in
the proximal colon. Small volume of stool in the distal colon. There
is transverse and sigmoid colonic redundancy. Minimal sigmoid
diverticulosis without diverticulitis.

Vascular/Lymphatic: Normal caliber abdominal aorta. Minimal aortic
atherosclerosis. Patent portal vein. No acute vascular findings. No
abdominopelvic adenopathy.

Reproductive: Hysterectomy.  No adnexal mass.

Other: No free air, free fluid, or intra-abdominal fluid collection.
Diminutive fat containing umbilical hernia

Musculoskeletal: There are no acute or suspicious osseous
abnormalities.
IMPRESSION: 1. No acute abnormality in the abdomen/pelvis.
2. Polycystic appearance of the kidneys. Occasional cysts in the
liver.
3. Minimal sigmoid diverticulosis without diverticulitis.
4. Mild hepatic steatosis.
5. Subsegmental left lower lobe atelectasis, with a slightly nodular
appearance, including a 6 mm nodule. It is unclear if this
represents a true pulmonary nodule or simply nodular appearance of
atelectasis. Regardless, recommend follow-up. Non-contrast chest CT
at 6-12 months is recommended. If the nodule is stable at time of
repeat CT, then future CT at 18-24 months (from today's scan) is
considered optional for low-risk patients, but is recommended for
high-risk patients. This recommendation follows the consensus
statement: Guidelines for Management of Incidental Pulmonary Nodules
Detected on CT Images: From the [HOSPITAL] 4895; Radiology

Aortic Atherosclerosis (HONNX-XS1.1).

## 2021-07-29 NOTE — Telephone Encounter (Signed)
error 

## 2021-09-06 ENCOUNTER — Ambulatory Visit (INDEPENDENT_AMBULATORY_CARE_PROVIDER_SITE_OTHER): Payer: PRIVATE HEALTH INSURANCE

## 2021-09-06 DIAGNOSIS — Z23 Encounter for immunization: Secondary | ICD-10-CM | POA: Diagnosis not present

## 2021-09-06 NOTE — Progress Notes (Signed)
Per orders of Dr. Sharen Hones, injection of Shingrix given by Lewanda Rife. Patient tolerated injection well.

## 2021-09-12 ENCOUNTER — Encounter: Payer: Self-pay | Admitting: Family Medicine

## 2021-09-14 ENCOUNTER — Encounter: Payer: Self-pay | Admitting: Family Medicine

## 2021-09-14 ENCOUNTER — Ambulatory Visit: Payer: PRIVATE HEALTH INSURANCE | Admitting: Family Medicine

## 2021-09-14 VITALS — BP 120/66 | HR 67 | Temp 97.6°F | Ht 64.0 in | Wt 124.1 lb

## 2021-09-14 DIAGNOSIS — L237 Allergic contact dermatitis due to plants, except food: Secondary | ICD-10-CM | POA: Insufficient documentation

## 2021-09-14 DIAGNOSIS — K649 Unspecified hemorrhoids: Secondary | ICD-10-CM

## 2021-09-14 MED ORDER — MUPIROCIN CALCIUM 2 % EX CREA
1.0000 | TOPICAL_CREAM | Freq: Two times a day (BID) | CUTANEOUS | 0 refills | Status: DC
Start: 2021-09-14 — End: 2022-04-28

## 2021-09-14 MED ORDER — PREDNISONE 10 MG PO TABS: ORAL_TABLET | ORAL | 0 refills | Status: AC

## 2021-09-14 MED ORDER — CEPHALEXIN 500 MG PO CAPS: 500.0000 mg | ORAL_CAPSULE | Freq: Three times a day (TID) | ORAL | 0 refills | Status: DC

## 2021-09-14 NOTE — Patient Instructions (Addendum)
Creo que tiene reaccion fuerte contra poison ivy o algo similar causando dermatitis de contact.  Pare triple antibiotic.  Use nueva crema mupirocina en la herida abierta.  Tome keflex antibiotico por 5 dias.  Siga prednisona - le he [employer] extension.  Dejenos saber si no va mejorando con esto.   Dermatitis por hiedra venenosa Poison Ivy Dermatitis La dermatitis por hiedra venenosa es la inflamacin de la piel que se produce a causa de sustancias qumicas de las hojas de dicha planta. La reaccin de la piel a menudo incluye enrojecimiento, hinchazn, ampollas y Financial risk analyst. Cules son las causas? Esta afeccin se produce por una sustancia qumica (urushiol) que se encuentra en la savia de la hiedra venenosa. La sustancia qumica es pegajosa y puede transmitirse fcilmente a personas, animales y [place]. Puede contraer dermatitis por hiedra venenosa en cualquiera de estos casos: Tiene contacto directo con una planta de hiedra venenosa. Toca animales, personas u objetos que han estado en contacto con la hiedra venenosa y en los que ha quedado la sustancia qumica. Qu incrementa el riesgo? Es ms probable que Dietitian en las personas que presentan alguna de estas caractersticas: Estn al Gu[place]upe Dawn a menudo en zonas boscosas o pantanosas. Estn al [employer] sin ropa de proteccin, como calzado cerrado, pantalones largos y camisa de Data processing manager. Cules son los signos o los sntomas? Los sntomas de esta afeccin incluyen: Piel enrojecida. Una gran picazn. Una erupcin cutnea con protuberancias y ampollas. Por lo general, la erupcin cutnea aparece 48 horas despus de la exposicin, si ha estado expuesto antes. Si es la primera vez que ha estado expuesto, es posible que la erupcin cutnea no aparezca hasta una semana despus de la exposicin. Hinchazn. Puede ocurrir si la reaccin es ms grave. Por lo general, los sntomas duran de 1 a 2 semanas. Sin  embargo, la primera vez que la afeccin aparece, los sntomas pueden durar entre 3 y 4 semanas. Cmo se diagnostica? Esta afeccin se puede diagnosticar en funcin de los sntomas y de un examen fsico. El mdico tambin puede hacerle preguntas sobre cualquier actividad reciente al [place]. Cmo se trata? El tratamiento de esta afeccin variar en funcin de la gravedad. El tratamiento puede incluir: Cremas con hidrocortisona o lociones con calamina para Associate Professor. Baos de avena para calmar la piel. Medicamentos como comprimidos antihistamnicos de Sales promotion account executive. Corticoesteroides por va oral para tratar reacciones ms graves. Siga estas instrucciones en su casa: Medicamentos Tome o aplquese los medicamentos de venta libre y los recetados solamente como se lo haya indicado el mdico. Utilice una crema con hidrocortisona o una locin con calamina para calmar la piel y Technical sales engineer picazn, tanto y como sea necesario. Instrucciones generales No se rasque ni se frote la piel. Aplique un pao fro y hmedo (compresa fra) en las zonas afectadas o tome baos de agua fra. Esto lo ayudar a Associate Professor. Evite baos y duchas calientes. Tome baos de avena segn sea necesario. Use avena coloidal. Puede conseguirla en la tienda de comestibles o la farmacia local. Siga las instrucciones del envase. Mientras tenga erupcin cutnea, lave las prendas que use inmediatamente despus de sacrselas. Concurra a todas las visitas de [address] se lo haya indicado el mdico. Esto es importante. Cmo se evita?  Aprenda a identificar la hiedra venenosa y evite el contacto con la planta. Esta planta puede reconocerse por la cantidad de hojas. En general, la hiedra venenosa tiene tres hojas,  con ramas que florecen de un solo tallo. Las hojas suelen ser brillantes y tienen bordes irregulares que terminan en una punta en el frente. Si ha estado expuesto a la hiedra venenosa, lvese muy bien  con agua y [place] de inmediato. Tiene alrededor de 30 minutos para eliminar la resina de la planta antes de que le cause una erupcin cutnea. Asegrese de lavarse debajo de las uas de las [place], porque todo resto de resina seguir diseminando la erupcin cutnea. Al hacer excursiones o ir de campamento, use ropa que lo ayude a evitar la exposicin de la piel. Esto incluye pantalones largos, camisa de [employer], medias altas y botas para caminar. Tambin puede aplicarse una locin preventiva en la piel, que lo ayudar a limitar la exposicin. Si sospecha que su ropa o el equipo especfico para el aire libre entraron en contacto con una hiedra venenosa, enjuguelos con una manguera de jardn antes de llevarlos a su casa. Cuando trabaje en el jardn o haga jardinera, use guantes, mangas largas, pantalones largos y botas. Lave las herramientas de jardn y los guantes si estn en contacto con la hiedra venenosa. Si sospecha que su mascota ha entrado en contacto con la hiedra venenosa, lave al animal con champ para mascotas y [place]. Asegrese de usar [employer] lava a Conservation officer, nature. Comunquese con un mdico si tiene: lceras abiertas en la zona de la erupcin cutnea. Ms enrojecimiento, hinchazn o dolor en la zona afectada. Enrojecimiento que se extiende ms all de la zona de la erupcin cutnea. Lquido, sangre o pus que emana de la zona afectada. Grant Ruts. Una erupcin cutnea en una zona extensa del cuerpo. Una erupcin cutnea en los ojos, la boca o los genitales. Una erupcin cutnea que no mejora despus de unas semanas. Solicite ayuda inmediatamente si: Se le hincha la cara o se le hinchan los prpados al punto de cerrarse. Tiene dificultad para respirar. Tiene dificultad para tragar. Estos sntomas pueden representar un problema grave que constituye Radio broadcast assistant. No espere a ver si los sntomas desaparecen. Solicite atencin mdica de inmediato. Comunquese con el servicio de  emergencias de su localidad (911 en los [place]). No conduzca por sus propios medios [employer] hospital. Resumen La dermatitis por hiedra venenosa es la inflamacin de la piel que se produce a causa de sustancias qumicas de las hojas de dicha planta. Los sntomas de esta afeccin incluyen enrojecimiento, picazn, erupcin cutnea e hinchazn. No se rasque ni se frote la piel. Tome o aplquese los medicamentos de venta libre y los recetados solamente como se lo haya indicado el mdico. Esta informacin no tiene Theme park manager el consejo del mdico. Asegrese de hacerle al mdico cualquier pregunta que tenga. Document Revised: 11/11/2020 Document Reviewed: 11/11/2020 Elsevier Patient Education  2023 [employer].

## 2021-09-14 NOTE — Telephone Encounter (Signed)
Pt has OV today at 12:00.

## 2021-09-14 NOTE — Assessment & Plan Note (Signed)
Ongoing contact dermatitis to poison ivy, not responding to oral prednisone and triamcinolone. Anticipate possible infective component and/or reaction to topical neosporin in triple abx - treat with mupirocin antibiotic cream topically, start keflex 500mg  TID to cover cellulitis component, will also extend prednisone course with 10mg  taper. Update if not improving with this.

## 2021-09-14 NOTE — Progress Notes (Signed)
Patient ID: Diane Hood, female    DOB: [birth year], 51 y.o.   MRN: [id number]  This visit was conducted in person.  BP 120/66   Pulse 67   Temp 97.6 F (36.4 C) (Temporal)   Ht 5\' 4"  (1.626 m)   Wt 124 lb 2 oz (56.3 kg)   SpO2 97%   BMI 21.31 kg/m    CC: leg rash  Subjective:   HPI: Diane Hood is a 51 y.o. female presenting on 09/14/2021 for Rash (C/o rash on L leg.  Started 09/10/21    Tried prednisone and triamcinolone cream.   )   Rash started last Monday. She developed linear abrasion to L medial lower leg. 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 Initially treated with cortizone-10. Rash has progressed with erythema, involving bilateral legs, including denuded skin over initial blister. Very itchy.   Saw E-visit through insurance, treated with topical triamcinolone and oral prednisone 40mg  5d burst.   Everyone who worked outdoors has had a similar rash.  She's never had poison ivy exposure/dermatitis.   She received 2nd shingrix vaccine last week.      Relevant past medical, surgical, family and social history reviewed and updated as indicated. Interim medical history since our last visit reviewed. Allergies and medications reviewed and updated. Outpatient Medications Prior to Visit  Medication Sig Dispense Refill   Cholecalciferol 125 MCG (5000 UT) TABS      loratadine (CLARITIN) 10 MG tablet Take 10 mg by mouth daily.     predniSONE (DELTASONE) 20 MG tablet Take two tablets daily for 3 days followed by one tablet daily for 4 days 10 tablet 0   sertraline (ZOLOFT) 50 MG tablet Take 1.5 tablets (75 mg total) by mouth daily. 135 tablet 3   valACYclovir (VALTREX) 500 MG tablet TAKE 2 TABLETS BY MOUTH AS NEEDED 20 tablet 1   albuterol (VENTOLIN HFA) 108 (90 Base) MCG/ACT inhaler INHALE 2 PUFFS BY MOUTH EVERY 6 HOURS AS NEEDED FOR WHEEZE OR SHORTNESS OF BREATH 8.5 each 0   chlorpheniramine-HYDROcodone (TUSSIONEX  PENNKINETIC ER) 10-8 MG/5ML Take 5 mLs by mouth every 12 (twelve) hours as needed for cough. 115 mL 0   estradiol (ESTRACE) 1 MG tablet Take 1 tablet (1 mg total) by mouth daily. 90 tablet 3   No facility-administered medications prior to visit.     Per HPI unless specifically indicated in ROS section below Review of Systems  Objective:  BP 120/66   Pulse 67   Temp 97.6 F (36.4 C) (Temporal)   Ht 5\' 4"  (1.626 m)   Wt 124 lb 2 oz (56.3 kg)   SpO2 97%   BMI 21.31 kg/m   Wt Readings from Last 3 Encounters:  09/14/21 124 lb 2 oz (56.3 kg)  05/31/21 128 lb (58.1 kg)  04/19/21 126 lb (57.2 kg)      Physical Exam Vitals and nursing note reviewed.  Constitutional:      Appearance: Normal appearance. She is not ill-appearing.  Skin:    General: Skin is warm and dry.     Findings: Erythema and rash present. Rash is macular, papular and vesicular.          Comments:  Strongly erythematous, non blanching, microvesicular rash to left lower medial leg with open sore in middle of rash, pruritic  Smaller spots up left leg Similar less erythematous patches to right medial leg at knee  Neurological:  Mental Status: She is alert.    L medial leg   R upper leg      Assessment & Plan:   Problem List Items Addressed This Visit     Contact dermatitis due to poison ivy - Primary    Ongoing contact dermatitis to poison ivy, not responding to oral prednisone and triamcinolone. Anticipate possible infective component and/or reaction to topical neosporin in triple abx - treat with mupirocin antibiotic cream topically, start keflex 500mg  TID to cover cellulitis component, will also extend prednisone course with 10mg  taper. Update if not improving with this.         Meds ordered this encounter  Medications   predniSONE (DELTASONE) 10 MG tablet    Sig: Take 4 tablets (40 mg total) by mouth daily with breakfast for 2 days, THEN 3 tablets (30 mg total) daily with breakfast for 2  days, THEN 2 tablets (20 mg total) daily with breakfast for 2 days, THEN 1 tablet (10 mg total) daily with breakfast for 2 days.    Dispense:  20 tablet    Refill:  0   cephALEXin (KEFLEX) 500 MG capsule    Sig: Take 1 capsule (500 mg total) by mouth 3 (three) times daily.    Dispense:  15 capsule    Refill:  0   mupirocin cream (BACTROBAN) 2 %    Sig: Apply 1 Application topically 2 (two) times daily.    Dispense:  15 g    Refill:  0    Formulate cream vs ointment based on insurance preference/affordability   No orders of the defined types were placed in this encounter.    Patient instructions: Creo que tiene reaccion fuerte contra poison ivy o algo similar causando dermatitis de contact.  Pare triple antibiotic.  Use nueva crema mupirocina en la herida abierta.  Tome keflex antibiotico por 5 dias.  Siga prednisona - le he extension.  Dejenos saber si no va mejorando con esto.   Follow up plan: Return if symptoms worsen or fail to improve.  , MD

## 2021-09-20 NOTE — Telephone Encounter (Signed)
See office note

## 2021-10-19 ENCOUNTER — Other Ambulatory Visit: Payer: Self-pay | Admitting: Family Medicine

## 2021-10-19 DIAGNOSIS — R232 Flushing: Secondary | ICD-10-CM

## 2021-10-26 ENCOUNTER — Ambulatory Visit: Payer: PRIVATE HEALTH INSURANCE

## 2021-12-16 DIAGNOSIS — Z419 Encounter for procedure for purposes other than remedying health state, unspecified: Secondary | ICD-10-CM | POA: Diagnosis not present

## 2022-01-16 DIAGNOSIS — Z419 Encounter for procedure for purposes other than remedying health state, unspecified: Secondary | ICD-10-CM | POA: Diagnosis not present

## 2022-02-16 DIAGNOSIS — Z419 Encounter for procedure for purposes other than remedying health state, unspecified: Secondary | ICD-10-CM | POA: Diagnosis not present

## 2022-03-17 DIAGNOSIS — Z419 Encounter for procedure for purposes other than remedying health state, unspecified: Secondary | ICD-10-CM | POA: Diagnosis not present

## 2022-04-08 ENCOUNTER — Other Ambulatory Visit: Payer: Self-pay | Admitting: Family Medicine

## 2022-04-08 DIAGNOSIS — Z1159 Encounter for screening for other viral diseases: Secondary | ICD-10-CM

## 2022-04-08 DIAGNOSIS — E785 Hyperlipidemia, unspecified: Secondary | ICD-10-CM | POA: Insufficient documentation

## 2022-04-08 DIAGNOSIS — E7800 Pure hypercholesterolemia, unspecified: Secondary | ICD-10-CM

## 2022-04-08 DIAGNOSIS — N182 Chronic kidney disease, stage 2 (mild): Secondary | ICD-10-CM

## 2022-04-08 DIAGNOSIS — E0590 Thyrotoxicosis, unspecified without thyrotoxic crisis or storm: Secondary | ICD-10-CM

## 2022-04-08 DIAGNOSIS — Q613 Polycystic kidney, unspecified: Secondary | ICD-10-CM

## 2022-04-12 ENCOUNTER — Other Ambulatory Visit (INDEPENDENT_AMBULATORY_CARE_PROVIDER_SITE_OTHER): Payer: Medicaid Other

## 2022-04-12 DIAGNOSIS — Z1159 Encounter for screening for other viral diseases: Secondary | ICD-10-CM

## 2022-04-12 DIAGNOSIS — E0590 Thyrotoxicosis, unspecified without thyrotoxic crisis or storm: Secondary | ICD-10-CM | POA: Diagnosis not present

## 2022-04-12 DIAGNOSIS — N182 Chronic kidney disease, stage 2 (mild): Secondary | ICD-10-CM

## 2022-04-12 DIAGNOSIS — Q613 Polycystic kidney, unspecified: Secondary | ICD-10-CM | POA: Diagnosis not present

## 2022-04-12 DIAGNOSIS — E7800 Pure hypercholesterolemia, unspecified: Secondary | ICD-10-CM

## 2022-04-12 LAB — LIPID PANEL
Cholesterol: 223 mg/dL — ABNORMAL HIGH (ref 0–200)
HDL: 59.6 mg/dL (ref >39.00)
LDL Cholesterol: 150 mg/dL — ABNORMAL HIGH (ref 0–99)
NonHDL: 163.46
Total CHOL/HDL Ratio: 4
Triglycerides: 67 mg/dL (ref 0.0–149.0)
VLDL: 13.4 mg/dL (ref 0.0–40.0)

## 2022-04-12 LAB — COMPREHENSIVE METABOLIC PANEL
ALT: 16 U/L (ref 0–35)
AST: 21 U/L (ref 0–37)
Albumin: 4.5 g/dL (ref 3.5–5.2)
Alkaline Phosphatase: 55 U/L (ref 39–117)
BUN: 16 mg/dL (ref 6–23)
CO2: 27 mEq/L (ref 19–32)
Calcium: 9.7 mg/dL (ref 8.4–10.5)
Chloride: 107 mEq/L (ref 96–112)
Creatinine, Ser: 0.96 mg/dL (ref 0.40–1.20)
GFR: 67.96 mL/min (ref >60.00)
Glucose, Bld: 88 mg/dL (ref 70–99)
Potassium: 4.6 mEq/L (ref 3.5–5.1)
Sodium: 140 mEq/L (ref 135–145)
Total Bilirubin: 0.8 mg/dL (ref 0.2–1.2)
Total Protein: 7.3 g/dL (ref 6.0–8.3)

## 2022-04-12 LAB — MICROALBUMIN / CREATININE URINE RATIO
Creatinine,U: 114.9 mg/dL
Microalb Creat Ratio: 0.6 mg/g (ref 0.0–30.0)
Microalb, Ur: 0.7 mg/dL (ref 0.0–1.9)

## 2022-04-12 LAB — CBC WITH DIFFERENTIAL/PLATELET
Basophils Absolute: 0.1 10*3/uL (ref 0.0–0.1)
Basophils Relative: 1.8 % (ref 0.0–3.0)
Eosinophils Absolute: 0.2 10*3/uL (ref 0.0–0.7)
Eosinophils Relative: 3.6 % (ref 0.0–5.0)
HCT: 38.1 % (ref 36.0–46.0)
Hemoglobin: 13 g/dL (ref 12.0–15.0)
Lymphocytes Relative: 39.6 % (ref 12.0–46.0)
Lymphs Abs: 2.5 10*3/uL (ref 0.7–4.0)
MCHC: 34.1 g/dL (ref 30.0–36.0)
MCV: 86.2 fl (ref 78.0–100.0)
Monocytes Absolute: 0.4 10*3/uL (ref 0.1–1.0)
Monocytes Relative: 5.8 % (ref 3.0–12.0)
Neutro Abs: 3.1 10*3/uL (ref 1.4–7.7)
Neutrophils Relative %: 49.2 % (ref 43.0–77.0)
Platelets: 336 10*3/uL (ref 150.0–400.0)
RBC: 4.41 Mil/uL (ref 3.87–5.11)
RDW: 13.1 % (ref 11.5–15.5)
WBC: 6.3 10*3/uL (ref 4.0–10.5)

## 2022-04-12 LAB — TSH: TSH: 0.38 u[IU]/mL (ref 0.35–5.50)

## 2022-04-12 LAB — T4, FREE: Free T4: 0.82 ng/dL (ref 0.60–1.60)

## 2022-04-13 LAB — T3: T3, Total: 115 ng/dL (ref 76–181)

## 2022-04-13 LAB — HEPATITIS C ANTIBODY: Hepatitis C Ab: NONREACTIVE

## 2022-04-17 DIAGNOSIS — Z419 Encounter for procedure for purposes other than remedying health state, unspecified: Secondary | ICD-10-CM | POA: Diagnosis not present

## 2022-04-21 ENCOUNTER — Encounter: Payer: PRIVATE HEALTH INSURANCE | Admitting: Family Medicine

## 2022-04-28 ENCOUNTER — Ambulatory Visit (INDEPENDENT_AMBULATORY_CARE_PROVIDER_SITE_OTHER): Payer: Medicaid Other | Admitting: Family Medicine

## 2022-04-28 ENCOUNTER — Encounter: Payer: Self-pay | Admitting: Family Medicine

## 2022-04-28 VITALS — BP 122/66 | HR 65 | Temp 97.2°F | Ht 64.25 in | Wt 127.0 lb

## 2022-04-28 DIAGNOSIS — R1013 Epigastric pain: Secondary | ICD-10-CM | POA: Diagnosis not present

## 2022-04-28 DIAGNOSIS — E041 Nontoxic single thyroid nodule: Secondary | ICD-10-CM

## 2022-04-28 DIAGNOSIS — E0590 Thyrotoxicosis, unspecified without thyrotoxic crisis or storm: Secondary | ICD-10-CM

## 2022-04-28 DIAGNOSIS — K649 Unspecified hemorrhoids: Secondary | ICD-10-CM

## 2022-04-28 DIAGNOSIS — Z0000 Encounter for general adult medical examination without abnormal findings: Secondary | ICD-10-CM

## 2022-04-28 DIAGNOSIS — E7800 Pure hypercholesterolemia, unspecified: Secondary | ICD-10-CM | POA: Diagnosis not present

## 2022-04-28 DIAGNOSIS — R911 Solitary pulmonary nodule: Secondary | ICD-10-CM | POA: Diagnosis not present

## 2022-04-28 DIAGNOSIS — Z8619 Personal history of other infectious and parasitic diseases: Secondary | ICD-10-CM

## 2022-04-28 DIAGNOSIS — N182 Chronic kidney disease, stage 2 (mild): Secondary | ICD-10-CM | POA: Diagnosis not present

## 2022-04-28 DIAGNOSIS — Q613 Polycystic kidney, unspecified: Secondary | ICD-10-CM | POA: Diagnosis not present

## 2022-04-28 DIAGNOSIS — F331 Major depressive disorder, recurrent, moderate: Secondary | ICD-10-CM | POA: Diagnosis not present

## 2022-04-28 DIAGNOSIS — K580 Irritable bowel syndrome with diarrhea: Secondary | ICD-10-CM

## 2022-04-28 MED ORDER — VALACYCLOVIR HCL 1 G PO TABS: 2000.0000 mg | ORAL_TABLET | Freq: Two times a day (BID) | ORAL | 3 refills | Status: DC

## 2022-04-28 MED ORDER — HYDROCORTISONE ACETATE 25 MG RE SUPP: 25.0000 mg | Freq: Two times a day (BID) | RECTAL | 1 refills | Status: DC

## 2022-04-28 MED ORDER — DULOXETINE HCL 30 MG PO CPEP: 30.0000 mg | ORAL_CAPSULE | Freq: Every day | ORAL | 6 refills | Status: DC

## 2022-04-28 NOTE — Assessment & Plan Note (Signed)
Appreciate renal care in [facility]. Cr/GFR and Umicroalb stable.

## 2022-04-28 NOTE — Patient Instructions (Addendum)
Call to schedule mammogram at your convenience: [facility] [phone].  Trabaje en dieta baja en colesterol.  Comienze cymbalta 30mg  diarios en vez de sertralina.  He rellenado valtrex y anusol suppositorios.  Gusto verla hoy! Regresar en 1 ao para proximo examen fisico.

## 2022-04-28 NOTE — Assessment & Plan Note (Addendum)
TSH remains low, fT4 and T3 normal range. Continue yearly check.  H/o focal uptake on nuclear thyroid uptake scan 11/2017.

## 2022-04-28 NOTE — Assessment & Plan Note (Signed)
Valtrex 2gm BID x1 d refilled #20, RF 3

## 2022-04-28 NOTE — Assessment & Plan Note (Signed)
Preventative protocols reviewed and updated unless pt declined. Discussed healthy diet and lifestyle.  

## 2022-04-28 NOTE — Assessment & Plan Note (Signed)
S/p extensive GI evaluation pending [facility] opinion at [facility] center, [facility] declined case awaiting [facility] decision.

## 2022-04-28 NOTE — Assessment & Plan Note (Addendum)
Increased stress with recent divorce from husband.  No significant benefit on sertraline.  Trial cymbalta 30mg  daily.

## 2022-04-28 NOTE — Assessment & Plan Note (Addendum)
Chronic, deteriorating off medication. Low chol diet handout provided.  The 10-year ASCVD risk score (Arnett DK, et al., 2019) is: 1.4%   Values used to calculate the score:     Age: 52 years     Sex: Female     Is Non-Hispanic African American: No     Diabetic: No     Tobacco smoker: No     Systolic Blood Pressure: 122 mmHg     Is BP treated: No     HDL Cholesterol: 59.6 mg/dL     Total Cholesterol: 223 mg/dL

## 2022-04-28 NOTE — Assessment & Plan Note (Signed)
Rx anusol suppository PRN

## 2022-04-28 NOTE — Progress Notes (Signed)
Ph: [phone]       Fax: [phone]   Patient ID: Diane Hood, female    DOB: May 09, 1970, 52 y.o.   MRN: [id number]  This visit was conducted in person.  BP 122/66   Pulse 65   Temp (!) 97.2 F (36.2 C) (Temporal)   Ht 5' 4.25" (1.632 m)   Wt 127 lb (57.6 kg)   SpO2 99%   BMI 21.63 kg/m    CC: CPE Subjective:   HPI: Diane Hood is a 52 y.o. female presenting on 04/28/2022 for Annual Exam   Divorced this year.   Polycystic kidney disease with hematuria followed by nephrology Dr Wynelle Link last seen 03/2021.   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 Consider rpt CT in 12 months. She states nodules are present and stable since 2013.   IBS, SIBO with epigastric pain - H pylori stool Ag neg 06/2020, s/p xifaxan treatment 08/2020 with partial benefit. Creon also helpful but not covered by insurance. EGD reassuring 10/2020. Also tried charcoal tablets without much benefit. Rec avoid artificial sweeteners/sugars. Ongoing significant bloating and gurgling with and without food. Low FODMAP diet didn't help. [facility] declined referral. Awaiting [facility] approval.   She notes ongoing gassiness, grumbling. No diet changes recently. Notes spaghetti causes marked abdominal distension.   Stopped sertraline 1 month ago, started taking Luminex mood supplement with folate, b12, st john's wort, and griffonia - without significant benefit noted.    Preventative: Colonoscopy 2018 WNL in [place]  Colonoscopy 2023 - WNL [name]) biopsies WNL EGD 2023 - WNL [name])  [facility]  Well woman exam - GYN Dr Vergie Living - pap WNL 12/2020.  Perimenopausal - ongoing hot flashes since ~2018. S/p hysterectomy in 2013 at age 70yo due to heavy bleeding, ovaries remain.  DEXA scan - not due  Lung cancer screening  - not eligible Flu shot - declined COVID shot - Pfizer 03/2019, 04/2019, booster 12/2019 Tdap 2019 Pneumonia shot - not due Shingrix - 04/2021, 08/2021  Advanced directive discussion -  Seat belt use discussed Sunscreen use discussed. No changing moles on skin.  Sleep - averaging 8 hours/night Non smoker Alcohol - none Dentist - q6 mo Eye exam - yearly   From [place] Hood Moved to the area 12/2018 from [place] ([place]) prior lived in [name] Divorced, close with 2 children (son and daughter) Occ: housewife  Activity: yoga, meditating, walking Diet: good water, fruits/vegetables daily      Relevant past medical, surgical, family and social history reviewed and updated as indicated. Interim medical history since our last visit reviewed. Allergies and medications reviewed and updated. Outpatient Medications Prior to Visit  Medication Sig Dispense Refill   loratadine (CLARITIN) 10 MG tablet Take 10 mg by mouth daily.     sertraline (ZOLOFT) 50 MG tablet TAKE 1 AND 1/2 TABLETS(75 MG) BY MOUTH DAILY 135 tablet 1   valACYclovir (VALTREX) 500 MG tablet TAKE 2 TABLETS BY MOUTH AS NEEDED 20 tablet 1   cephALEXin (KEFLEX) 500 MG capsule Take 1 capsule (500 mg total) by mouth 3 (three) times daily. 15 capsule 0   mupirocin cream (BACTROBAN) 2 % Apply 1 Application topically 2 (two) times daily. 15 g 0   predniSONE (DELTASONE) 20 MG tablet Take two tablets daily for 3 days followed by one tablet  daily for 4 days 10 tablet 0   No facility-administered medications prior to visit.     Per HPI unless specifically indicated in ROS section below Review of Systems  Constitutional:  Negative for activity change, appetite change, chills, fatigue, fever and unexpected weight change.  HENT:  Negative for hearing loss.   Eyes:  Negative for visual disturbance.  Respiratory:  Negative for cough, chest tightness, shortness of breath and wheezing.   Cardiovascular:  Positive for leg swelling  (during long trips). Negative for chest pain and palpitations.  Gastrointestinal:  Negative for abdominal distention, abdominal pain, blood in stool, constipation, diarrhea, nausea and vomiting.  Genitourinary:  Negative for difficulty urinating and hematuria.  Musculoskeletal:  Negative for arthralgias, myalgias and neck pain.  Skin:  Negative for rash.  Neurological:  Positive for headaches. Negative for dizziness, seizures and syncope.  Hematological:  Negative for adenopathy. Does not bruise/bleed easily.  Psychiatric/Behavioral:  Negative for dysphoric mood. The patient is not nervous/anxious.     Objective:  BP 122/66   Pulse 65   Temp (!) 97.2 F (36.2 C) (Temporal)   Ht 5' 4.25" (1.632 m)   Wt 127 lb (57.6 kg)   SpO2 99%   BMI 21.63 kg/m   Wt Readings from Last 3 Encounters:  04/28/22 127 lb (57.6 kg)  09/14/21 124 lb 2 oz (56.3 kg)  05/31/21 128 lb (58.1 kg)      Physical Exam Vitals and nursing note reviewed.  Constitutional:      Appearance: Normal appearance. She is not ill-appearing.  HENT:     Head: Normocephalic and atraumatic.     Right Ear: Tympanic membrane, ear canal and external ear normal. There is no impacted cerumen.     Left Ear: Tympanic membrane, ear canal and external ear normal. There is no impacted cerumen.     Mouth/Throat:     Mouth: Mucous membranes are moist.     Pharynx: Oropharynx is clear. No oropharyngeal exudate or posterior oropharyngeal erythema.  Eyes:     General:        Right eye: No discharge.        Left eye: No discharge.     Extraocular Movements: Extraocular movements intact.     Conjunctiva/sclera: Conjunctivae normal.     Pupils: Pupils are equal, round, and reactive to light.  Neck:     Thyroid: No thyroid mass or thyromegaly.  Cardiovascular:     Rate and Rhythm: Normal rate and regular rhythm.     Pulses: Normal pulses.     Heart sounds: Normal heart sounds. No murmur heard. Pulmonary:     Effort: Pulmonary effort  is normal. No respiratory distress.     Breath sounds: Normal breath sounds. No wheezing, rhonchi or rales.  Abdominal:     General: Bowel sounds are normal. There is no distension.     Palpations: Abdomen is soft. There is no mass.     Tenderness: There is no abdominal tenderness. There is no guarding or rebound.     Hernia: No hernia is present.  Musculoskeletal:     Cervical back: Normal range of motion and neck supple. No rigidity.     Right lower leg: No edema.     Left lower leg: No edema.  Lymphadenopathy:     Cervical: No cervical adenopathy.  Skin:    General: Skin is warm and dry.     Findings: No rash.  Neurological:     General: No focal deficit  present.     Mental Status: She is alert. Mental status is at baseline.  Psychiatric:        Mood and Affect: Mood normal.        Behavior: Behavior normal.       Results for orders placed or performed in visit on 04/12/22  Hepatitis C antibody  Result Value Ref Range   Hepatitis C Ab NON-REACTIVE NON-REACTIVE  T4, free  Result Value Ref Range   Free T4 0.82 0.60 - 1.60 ng/dL  T3  Result Value Ref Range   T3, Total 115 76 - 181 ng/dL  TSH  Result Value Ref Range   TSH 0.38 0.35 - 5.50 uIU/mL  Microalbumin / creatinine urine ratio  Result Value Ref Range   Microalb, Ur 0.7 0.0 - 1.9 mg/dL   Creatinine,U 712.4 mg/dL   Microalb Creat Ratio 0.6 0.0 - 30.0 mg/g  CBC with Differential/Platelet  Result Value Ref Range   WBC 6.3 4.0 - 10.5 K/uL   RBC 4.41 3.87 - 5.11 Mil/uL   Hemoglobin 13.0 12.0 - 15.0 g/dL   HCT 58.0 99.8 - 33.8 %   MCV 86.2 78.0 - 100.0 fl   MCHC 34.1 30.0 - 36.0 g/dL   RDW 25.0 53.9 - 76.7 %   Platelets 336.0 150.0 - 400.0 K/uL   Neutrophils Relative % 49.2 43.0 - 77.0 %   Lymphocytes Relative 39.6 12.0 - 46.0 %   Monocytes Relative 5.8 3.0 - 12.0 %   Eosinophils Relative 3.6 0.0 - 5.0 %   Basophils Relative 1.8 0.0 - 3.0 %   Neutro Abs 3.1 1.4 - 7.7 K/uL   Lymphs Abs 2.5 0.7 - 4.0 K/uL    Monocytes Absolute 0.4 0.1 - 1.0 K/uL   Eosinophils Absolute 0.2 0.0 - 0.7 K/uL   Basophils Absolute 0.1 0.0 - 0.1 K/uL  Lipid panel  Result Value Ref Range   Cholesterol 223 (H) 0 - 200 mg/dL   Triglycerides 34.1 0.0 - 149.0 mg/dL   HDL 93.79 >02.40 mg/dL   VLDL 97.3 0.0 - 53.2 mg/dL   LDL Cholesterol 992 (H) 0 - 99 mg/dL   Total CHOL/HDL Ratio 4    NonHDL 163.46   Comprehensive metabolic panel  Result Value Ref Range   Sodium 140 135 - 145 mEq/L   Potassium 4.6 3.5 - 5.1 mEq/L   Chloride 107 96 - 112 mEq/L   CO2 27 19 - 32 mEq/L   Glucose, Bld 88 70 - 99 mg/dL   BUN 16 6 - 23 mg/dL   Creatinine, Ser 4.26 0.40 - 1.20 mg/dL   Total Bilirubin 0.8 0.2 - 1.2 mg/dL   Alkaline Phosphatase 55 39 - 117 U/L   AST 21 0 - 37 U/L   ALT 16 0 - 35 U/L   Total Protein 7.3 6.0 - 8.3 g/dL   Albumin 4.5 3.5 - 5.2 g/dL   GFR 83.41 >96.22 mL/min   Calcium 9.7 8.4 - 10.5 mg/dL      2/97/9892   11:94 PM 04/19/2021   10:06 AM 09/12/2019   10:18 AM  Depression screen PHQ 2/9  Decreased Interest 2 1 0  Down, Depressed, Hopeless 1 1 0  PHQ - 2 Score 3 2 0  Altered sleeping 1 1   Tired, decreased energy 1 1   Change in appetite 0 0   Feeling bad or failure about yourself  0 0   Trouble concentrating 0 0   Moving slowly or fidgety/restless  0 0   Suicidal thoughts 0 0   PHQ-9 Score 5 4   Difficult doing work/chores Somewhat difficult Somewhat difficult        04/28/2022   12:12 PM 04/19/2021   10:06 AM  GAD 7 : Generalized Anxiety Score  Nervous, Anxious, on Edge 1 1  Control/stop worrying 1 0  Worry too much - different things 1 0  Trouble relaxing 1 1  Restless 0 0  Easily annoyed or irritable 0 0  Afraid - awful might happen 0 0  Total GAD 7 Score 4 2  Anxiety Difficulty Somewhat difficult Somewhat difficult   Assessment & Plan:   Problem List Items Addressed This Visit     Health maintenance examination - Primary (Chronic)    Preventative protocols reviewed and updated unless  pt declined. Discussed healthy diet and lifestyle.       Chronic kidney disease, stage 2 (mild)    Appreciate renal care in PCKD. Cr/GFR and Umicroalb stable.       Polycystic kidney disease    Continue nephrology f/u.      Subclinical hyperthyroidism    TSH remains low, fT4 and T3 normal range. Continue yearly check.  H/o focal uptake on nuclear thyroid uptake scan 11/2017.       Pulmonary nodule 1 cm or greater in diameter    Stable nodules present since 2013. No further imaging needed.       Dyspepsia    S/p extensive GI evaluation pending second opinion at academic center, [facility] declined case awaiting [facility] decision.       Moderate episode of recurrent major depressive disorder    Increased stress with recent divorce from husband.  No significant benefit on sertraline.  Trial cymbalta 30mg  daily.       Relevant Medications   DULoxetine (CYMBALTA) 30 MG capsule   Thyroid nodule   Irritable bowel syndrome with diarrhea    Trial cymbalta. Consider IBGuard.       Hyperlipidemia    Chronic, deteriorating off medication. Low chol diet handout provided.  The 10-year ASCVD risk score (Arnett DK, et al., 2019) is: 1.4%   Values used to calculate the score:     Age: 61 years     Sex: Female     Is Non-Hispanic African American: No     Diabetic: No     Tobacco smoker: No     Systolic Blood Pressure: 122 mmHg     Is BP treated: No     HDL Cholesterol: 59.6 mg/dL     Total Cholesterol: 223 mg/dL       H/O cold sores    Valtrex 2gm BID x1 d refilled #20, RF 3      Relevant Medications   valACYclovir (VALTREX) 1000 MG tablet   Hemorrhoid    Rx anusol suppository PRN         Meds ordered this encounter  Medications   hydrocortisone (ANUSOL-HC) 25 MG suppository    Sig: Place 1 suppository (25 mg total) rectally 2 (two) times daily.    Dispense:  12 suppository    Refill:  1   valACYclovir (VALTREX) 1000 MG tablet    Sig: Take 2 tablets (2,000 mg total) by  mouth in the morning and at bedtime. For 1 day per cold sore flare    Dispense:  20 tablet    Refill:  3   DULoxetine (CYMBALTA) 30 MG capsule    Sig: Take 1 capsule (30 mg total)  by mouth daily.    Dispense:  30 capsule    Refill:  6    No orders of the defined types were placed in this encounter.   Patient Instructions  Call to schedule mammogram at your convenience: [facility] [phone].  Trabaje en dieta baja en colesterol.  Comienze cymbalta  diarios en vez de sertralina.  He rellenado valtrex y anusol suppositorios.  Gusto verla hoy! Regresar en 1 ao para proximo examen fisico.  Follow up plan: Return in about 1 year (around 04/28/2023) for annual exam, prior fasting for blood work.  Eustaquio Boyden, MD

## 2022-04-28 NOTE — Assessment & Plan Note (Signed)
Continue nephrology f/u.

## 2022-04-28 NOTE — Assessment & Plan Note (Signed)
Trial cymbalta. Consider IBGuard.

## 2022-04-28 NOTE — Assessment & Plan Note (Signed)
Stable nodules present since 2013. No further imaging needed.

## 2022-05-17 DIAGNOSIS — Z419 Encounter for procedure for purposes other than remedying health state, unspecified: Secondary | ICD-10-CM | POA: Diagnosis not present

## 2022-05-23 ENCOUNTER — Other Ambulatory Visit: Payer: Self-pay | Admitting: Family Medicine

## 2022-05-23 NOTE — Telephone Encounter (Signed)
Message from pharmacy:  REQUEST FOR 90 DAYS PRESCRIPTION.   Last filled:  04/30/22, #30 Last OV:  04/28/22, CPE Next OV:  none

## 2022-06-17 DIAGNOSIS — Z419 Encounter for procedure for purposes other than remedying health state, unspecified: Secondary | ICD-10-CM | POA: Diagnosis not present

## 2022-07-17 DIAGNOSIS — Z419 Encounter for procedure for purposes other than remedying health state, unspecified: Secondary | ICD-10-CM | POA: Diagnosis not present

## 2022-08-17 DIAGNOSIS — Z419 Encounter for procedure for purposes other than remedying health state, unspecified: Secondary | ICD-10-CM | POA: Diagnosis not present

## 2022-09-17 DIAGNOSIS — Z419 Encounter for procedure for purposes other than remedying health state, unspecified: Secondary | ICD-10-CM | POA: Diagnosis not present

## 2022-10-17 DIAGNOSIS — Z419 Encounter for procedure for purposes other than remedying health state, unspecified: Secondary | ICD-10-CM | POA: Diagnosis not present

## 2022-11-17 DIAGNOSIS — Z419 Encounter for procedure for purposes other than remedying health state, unspecified: Secondary | ICD-10-CM | POA: Diagnosis not present

## 2022-12-17 DIAGNOSIS — Z419 Encounter for procedure for purposes other than remedying health state, unspecified: Secondary | ICD-10-CM | POA: Diagnosis not present

## 2023-01-17 DIAGNOSIS — Z419 Encounter for procedure for purposes other than remedying health state, unspecified: Secondary | ICD-10-CM | POA: Diagnosis not present

## 2023-02-09 NOTE — Addendum Note (Signed)
Addended by: Eustaquio Boyden on: 02/09/2023 02:15 PM   Modules accepted: Orders

## 2023-02-17 DIAGNOSIS — Z419 Encounter for procedure for purposes other than remedying health state, unspecified: Secondary | ICD-10-CM | POA: Diagnosis not present

## 2023-03-17 DIAGNOSIS — Z419 Encounter for procedure for purposes other than remedying health state, unspecified: Secondary | ICD-10-CM | POA: Diagnosis not present

## 2023-04-28 DIAGNOSIS — Z419 Encounter for procedure for purposes other than remedying health state, unspecified: Secondary | ICD-10-CM | POA: Diagnosis not present

## 2023-05-11 ENCOUNTER — Encounter: Payer: Self-pay | Admitting: Family Medicine

## 2023-05-11 ENCOUNTER — Ambulatory Visit (INDEPENDENT_AMBULATORY_CARE_PROVIDER_SITE_OTHER): Admitting: Family Medicine

## 2023-05-11 VITALS — BP 116/76 | HR 71 | Temp 98.1°F | Ht 64.25 in | Wt 127.0 lb

## 2023-05-11 DIAGNOSIS — E0590 Thyrotoxicosis, unspecified without thyrotoxic crisis or storm: Secondary | ICD-10-CM

## 2023-05-11 DIAGNOSIS — Q613 Polycystic kidney, unspecified: Secondary | ICD-10-CM

## 2023-05-11 DIAGNOSIS — K649 Unspecified hemorrhoids: Secondary | ICD-10-CM | POA: Diagnosis not present

## 2023-05-11 DIAGNOSIS — E7800 Pure hypercholesterolemia, unspecified: Secondary | ICD-10-CM

## 2023-05-11 DIAGNOSIS — Z8619 Personal history of other infectious and parasitic diseases: Secondary | ICD-10-CM

## 2023-05-11 DIAGNOSIS — N393 Stress incontinence (female) (male): Secondary | ICD-10-CM

## 2023-05-11 DIAGNOSIS — R1013 Epigastric pain: Secondary | ICD-10-CM

## 2023-05-11 DIAGNOSIS — Z0000 Encounter for general adult medical examination without abnormal findings: Secondary | ICD-10-CM | POA: Diagnosis not present

## 2023-05-11 DIAGNOSIS — F331 Major depressive disorder, recurrent, moderate: Secondary | ICD-10-CM

## 2023-05-11 DIAGNOSIS — N182 Chronic kidney disease, stage 2 (mild): Secondary | ICD-10-CM | POA: Diagnosis not present

## 2023-05-11 MED ORDER — VALACYCLOVIR HCL 1 G PO TABS
2000.0000 mg | ORAL_TABLET | Freq: Two times a day (BID) | ORAL | 3 refills | Status: AC
Start: 2023-05-11 — End: ?

## 2023-05-11 MED ORDER — HYDROCORTISONE ACETATE 25 MG RE SUPP
25.0000 mg | Freq: Two times a day (BID) | RECTAL | 1 refills | Status: AC
Start: 2023-05-11 — End: ?

## 2023-05-11 NOTE — Assessment & Plan Note (Signed)
Refill hydrocortisone suppository

## 2023-05-11 NOTE — Assessment & Plan Note (Signed)
 Preventative protocols reviewed and updated unless pt declined. Discussed healthy diet and lifestyle.

## 2023-05-11 NOTE — Patient Instructions (Addendum)
 Call to schedule mammogram at your convenience: [facility] [phone] Haga ejercicios de [place].  Regrese para laboratorios en ayunas a su conveniencia.  Puede usar voltaren (diclofenac) crema topical sin receta.  Regresar en 1 ao para proximo fisico

## 2023-05-11 NOTE — Progress Notes (Signed)
 Ph: [phone] Fax: [phone]   Patient ID: Diane Hood, female    DOB: Feb 02, 1970, 53 y.o.   MRN: [id number]  This visit was conducted in person.  BP 116/76   Pulse 71   Temp 98.1 F (36.7 C) (Oral)   Ht 5' 4.25" (1.632 m)   Wt 127 lb (57.6 kg)   SpO2 98%   BMI 21.63 kg/m    CC: CPE Subjective:   HPI: Diane Hood is a 53 y.o. female presenting on 05/11/2023 for Annual Exam    Polycystic kidney disease with hematuria followed by nephrology Dr Lamount Pimple last seen 03/2021. Saw kidney doctor in [place] in March 2025 with stable renal US .   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 She states nodules are present and stable since 2013.   IBS, SIBO with epigastric pain - H pylori stool Ag neg 06/2020, s/p xifaxan  treatment 08/2020 with partial benefit. Creon  also helpful but not covered by insurance. EGD reassuring 10/2020. Tried charcoal tablets without much benefit. Rec avoiding artificial sweeteners/sugars. Ongoing bloating /gurgling with and without food. Spaghetti is a trigger. Low FODMAP diet didn't help. [facility] declined referral, awaiting [facility] eval. She has found guava leaf extract was very helpful. Has not tried IBGard.   Preventative: Colonoscopy 2018 WNL in [place]   Colonoscopy 2023 - WNL Antony Baumgartner) biopsies WNL EGD 2023 - [facility] Antony Baumgartner)  [facility] - E provided to schedule appt  Well woman exam - GYN Dr Aquilla Knapp - pap WNL 12/2020 - will stop s/p hysterectomy.  Perimenopausal - ongoing hot flashes since ~2018. S/p hysterectomy in 2013 at age 72yo due to heavy bleeding, ovaries remain. DEXA scan - not due  Lung cancer screening - not eligible Flu shot - declined COVID shot - Pfizer 03/2019, 04/2019, booster 12/2019 Tdap 2019  Pneumonia shot - not due  Shingrix  - 04/2021,  08/2021  Advanced directive discussion -  Seat belt use discussed Sunscreen use discussed. No changing moles on skin.  Sleep - averaging 8 hours/night Non smoker Alcohol - none Dentist - q6 mo Eye exam - yearly  Bowel - no constipation Bladder - mild stress incontinence   From [place]oved to the [place] 12/2018 from [place] prior lived in [place] Divorced, 2 children (son and daughter) Occ: housewife  Activity: yoga, meditating, walking Diet: good water, fruits/vegetables daily      Relevant past medical, surgical, family and social history reviewed and updated as indicated. Interim medical history since our last visit reviewed. Allergies and medications reviewed and updated. Outpatient Medications Prior to Visit  Medication Sig Dispense Refill   DULoxetine  (CYMBALTA ) 30 MG capsule TAKE 1 CAPSULE BY MOUTH EVERY DAY 90 capsule 3   hydrocortisone  (ANUSOL -HC) 25 MG suppository Place 1 suppository (25 mg total) rectally 2 (two) times daily. 12 suppository 1   valACYclovir  (VALTREX ) 1000 MG tablet Take 2 tablets (2,000 mg total) by mouth in the morning and at bedtime. For 1 day per cold sore flare 20 tablet 3   No facility-administered medications prior to visit.     Per HPI unless specifically indicated in ROS section below Review of Systems  Constitutional:  Negative for activity change, appetite change, chills, fatigue, fever and unexpected weight change.  HENT:  Negative for hearing loss.   Eyes:  Negative for visual  disturbance.  Respiratory:  Negative for cough, chest tightness, shortness of breath and wheezing.   Cardiovascular:  Negative for chest pain, palpitations and leg swelling.  Gastrointestinal:  Positive for abdominal distention and abdominal pain (intermittent gassiness/bloating). Negative for blood in stool, constipation, diarrhea, nausea and vomiting.  Genitourinary:  Negative for difficulty urinating and hematuria.  Musculoskeletal:  Negative  for arthralgias, myalgias and neck pain.  Skin:  Negative for rash.  Neurological:  Negative for dizziness, seizures, syncope and headaches.  Hematological:  Negative for adenopathy. Does not bruise/bleed easily.  Psychiatric/Behavioral:  Negative for dysphoric mood. The patient is not nervous/anxious.     Objective:  BP 116/76   Pulse 71   Temp 98.1 F (36.7 C) (Oral)   Ht 5' 4.25" (1.632 m)   Wt 127 lb (57.6 kg)   SpO2 98%   BMI 21.63 kg/m   Wt Readings from Last 3 Encounters:  05/11/23 127 lb (57.6 kg)  04/28/22 127 lb (57.6 kg)  09/14/21 124 lb 2 oz (56.3 kg)      Physical Exam Vitals and nursing note reviewed.  Constitutional:      Appearance: Normal appearance. She is not ill-appearing.  HENT:     Head: Normocephalic and atraumatic.     Right Ear: Tympanic membrane, ear canal and external ear normal. There is no impacted cerumen.     Left Ear: Tympanic membrane, ear canal and external ear normal. There is no impacted cerumen.     Mouth/Throat:     Mouth: Mucous membranes are moist.     Pharynx: Oropharynx is clear. No oropharyngeal exudate or posterior oropharyngeal erythema.  Eyes:     General:        Right eye: No discharge.        Left eye: No discharge.     Extraocular Movements: Extraocular movements intact.     Conjunctiva/sclera: Conjunctivae normal.     Pupils: Pupils are equal, round, and reactive to light.  Neck:     Thyroid : No thyroid  mass or thyromegaly.  Cardiovascular:     Rate and Rhythm: Normal rate and regular rhythm.     Pulses: Normal pulses.     Heart sounds: Normal heart sounds. No murmur heard. Pulmonary:     Effort: Pulmonary effort is normal. No respiratory distress.     Breath sounds: Normal breath sounds. No wheezing, rhonchi or rales.  Abdominal:     General: Bowel sounds are normal. There is no distension.     Palpations: Abdomen is soft. There is no mass.     Tenderness: There is no abdominal tenderness. There is no guarding or  rebound.     Hernia: No hernia is present.  Musculoskeletal:     Cervical back: Normal range of motion and neck supple. No rigidity.     Right lower leg: No edema.     Left lower leg: No edema.  Lymphadenopathy:     Cervical: No cervical adenopathy.  Skin:    General: Skin is warm and dry.     Findings: No rash.  Neurological:     General: No focal deficit present.     Mental Status: She is alert. Mental status is at baseline.  Psychiatric:        Mood and Affect: Mood normal.        Behavior: Behavior normal.         Assessment & Plan:   Problem List Items Addressed This Visit     Health maintenance  examination - Primary (Chronic)   Preventative protocols reviewed and updated unless pt declined. Discussed healthy diet and lifestyle.       Chronic kidney disease, stage 2 (mild)   H/o PCKD.  Update kidney function and Umicroalb when she returns for fasting labs.       Relevant Orders   Comprehensive metabolic panel with GFR   CBC with Differential/Platelet   Microalbumin / creatinine urine ratio   Polycystic kidney disease   Has seen nephrologist. Most recently had renal eval in [place] with stable US .  Update labs when she returns fasting.       Subclinical hyperthyroidism   H/o thyroid  nodule with normal rad I uptake scan ~2019 Update thyroid  function next labwork.       Relevant Orders   TSH   T4, free   T3   Dyspepsia   Chronic issue with diet related gassiness/bloating, s/p reassuring GI evaluation including reassuring colonoscopy and EGD 2023.  Creon  somewhat helpful but unaffordable.  Has tried charcoal tablets, low FODMAP diet without benefit.  Most recently using guava leaf extract with benefit - discussed this.       Moderate episode of recurrent major depressive disorder (HCC)   She has been slowly weaning off cymbalta  - down to 30mg  a few times a week.  Discussed ok to fully stop at this time.       Hyperlipidemia   Chronic, update FLP off  medication when she returns fasting. The 10-year ASCVD risk score (Arnett DK, et al., 2019) is: 1.4%   Values used to calculate the score:     Age: 77 years     Sex: Female     Is Non-Hispanic African American: No     Diabetic: No     Tobacco smoker: No     Systolic Blood Pressure: 116 mmHg     Is BP treated: No     HDL Cholesterol: 59.6 mg/dL     Total Cholesterol: 223 mg/dL       Relevant Orders   Lipid panel   Comprehensive metabolic panel with GFR   H/O cold sores   Continue PRN valtrex .       Relevant Medications   valACYclovir  (VALTREX ) 1000 MG tablet   Hemorrhoid   Refill hydrocortisone  suppository.       Relevant Medications   hydrocortisone  (ANUSOL -HC) 25 MG suppository   Stress incontinence   Mild. Discussed Kegel exercises.         Meds ordered this encounter  Medications   hydrocortisone  (ANUSOL -HC) 25 MG suppository    Sig: Place 1 suppository (25 mg total) rectally 2 (two) times daily.    Dispense:  12 suppository    Refill:  1   valACYclovir  (VALTREX ) 1000 MG tablet    Sig: Take 2 tablets (2,000 mg total) by mouth in the morning and at bedtime. For 1 day per cold sore flare    Dispense:  20 tablet    Refill:  3    Orders Placed This Encounter  Procedures   Lipid panel    Standing Status:   Future    Expiration Date:   05/10/2024   Comprehensive metabolic panel with GFR    Standing Status:   Future    Expiration Date:   05/10/2024   TSH    Standing Status:   Future    Expiration Date:   05/10/2024   T4, free    Standing Status:   Future    Expiration Date:  05/10/2024   CBC with Differential/Platelet    Standing Status:   Future    Expiration Date:   05/10/2024   Microalbumin / creatinine urine ratio    Standing Status:   Future    Expiration Date:   05/10/2024   T3    Standing Status:   Future    Expiration Date:   05/10/2024    Patient Instructions  Call to schedule mammogram at your convenience: [facility] [phone] Haga ejercicios de Kegel.  Regrese para laboratorios en ayunas a su conveniencia.  Puede usar voltaren (diclofenac) crema topical sin receta.  Regresar en 1 ao para proximo fisico  Follow up plan: No follow-ups on file.  Claire Crick, MD

## 2023-05-11 NOTE — Assessment & Plan Note (Signed)
 Continue PRN valtrex.

## 2023-05-11 NOTE — Assessment & Plan Note (Addendum)
 She has been slowly weaning off cymbalta  - down to 30mg  a few times a week.  Discussed ok to fully stop at this time.

## 2023-05-12 DIAGNOSIS — N393 Stress incontinence (female) (male): Secondary | ICD-10-CM | POA: Insufficient documentation

## 2023-05-12 NOTE — Assessment & Plan Note (Signed)
 Has seen nephrologist. Most recently had renal eval in [place] with stable US .  Update labs when she returns fasting.

## 2023-05-12 NOTE — Assessment & Plan Note (Signed)
 Mild. Discussed Kegel exercises.

## 2023-05-12 NOTE — Assessment & Plan Note (Signed)
 Chronic, update FLP off medication when she returns fasting. The 10-year ASCVD risk score (Arnett DK, et al., 2019) is: 1.4%   Values used to calculate the score:     Age: 53 years     Sex: Female     Is Non-Hispanic African American: No     Diabetic: No     Tobacco smoker: No     Systolic Blood Pressure: 116 mmHg     Is BP treated: No     HDL Cholesterol: 59.6 mg/dL     Total Cholesterol: 223 mg/dL

## 2023-05-12 NOTE — Assessment & Plan Note (Signed)
 H/o thyroid  nodule with normal rad I uptake scan ~2019 Update thyroid  function next labwork.

## 2023-05-12 NOTE — Assessment & Plan Note (Addendum)
 Chronic issue with diet related gassiness/bloating, s/p reassuring GI evaluation including reassuring colonoscopy and EGD 2023.  Creon  somewhat helpful but unaffordable.  Has tried charcoal tablets, low FODMAP diet without benefit.  Most recently using guava leaf extract with benefit - discussed this.

## 2023-05-12 NOTE — Assessment & Plan Note (Addendum)
 H/o PCKD.  Update kidney function and Umicroalb when she returns for fasting labs.

## 2023-05-14 ENCOUNTER — Other Ambulatory Visit (INDEPENDENT_AMBULATORY_CARE_PROVIDER_SITE_OTHER)

## 2023-05-14 DIAGNOSIS — E0590 Thyrotoxicosis, unspecified without thyrotoxic crisis or storm: Secondary | ICD-10-CM

## 2023-05-14 DIAGNOSIS — E7800 Pure hypercholesterolemia, unspecified: Secondary | ICD-10-CM

## 2023-05-14 DIAGNOSIS — N182 Chronic kidney disease, stage 2 (mild): Secondary | ICD-10-CM

## 2023-05-14 LAB — CBC WITH DIFFERENTIAL/PLATELET
Basophils Absolute: 0.1 10*3/uL (ref 0.0–0.1)
Basophils Relative: 1.2 % (ref 0.0–3.0)
Eosinophils Absolute: 0.2 10*3/uL (ref 0.0–0.7)
Eosinophils Relative: 2.8 % (ref 0.0–5.0)
HCT: 39.3 % (ref 36.0–46.0)
Hemoglobin: 13.2 g/dL (ref 12.0–15.0)
Lymphocytes Relative: 35.8 % (ref 12.0–46.0)
Lymphs Abs: 2.3 10*3/uL (ref 0.7–4.0)
MCHC: 33.6 g/dL (ref 30.0–36.0)
MCV: 87.7 fl (ref 78.0–100.0)
Monocytes Absolute: 0.3 10*3/uL (ref 0.1–1.0)
Monocytes Relative: 5.1 % (ref 3.0–12.0)
Neutro Abs: 3.5 10*3/uL (ref 1.4–7.7)
Neutrophils Relative %: 55.1 % (ref 43.0–77.0)
Platelets: 354 10*3/uL (ref 150.0–400.0)
RBC: 4.48 Mil/uL (ref 3.87–5.11)
RDW: 13.5 % (ref 11.5–15.5)
WBC: 6.4 10*3/uL (ref 4.0–10.5)

## 2023-05-14 LAB — COMPREHENSIVE METABOLIC PANEL WITH GFR
ALT: 20 U/L (ref 0–35)
AST: 22 U/L (ref 0–37)
Albumin: 4.6 g/dL (ref 3.5–5.2)
Alkaline Phosphatase: 63 U/L (ref 39–117)
BUN: 14 mg/dL (ref 6–23)
CO2: 26 meq/L (ref 19–32)
Calcium: 9.6 mg/dL (ref 8.4–10.5)
Chloride: 108 meq/L (ref 96–112)
Creatinine, Ser: 0.9 mg/dL (ref 0.40–1.20)
GFR: 72.88 mL/min (ref >60.00)
Glucose, Bld: 96 mg/dL (ref 70–99)
Potassium: 4.3 meq/L (ref 3.5–5.1)
Sodium: 142 meq/L (ref 135–145)
Total Bilirubin: 0.8 mg/dL (ref 0.2–1.2)
Total Protein: 7.2 g/dL (ref 6.0–8.3)

## 2023-05-14 LAB — LIPID PANEL
Cholesterol: 224 mg/dL — ABNORMAL HIGH (ref 0–200)
HDL: 59.8 mg/dL (ref >39.00)
LDL Cholesterol: 150 mg/dL — ABNORMAL HIGH (ref 0–99)
NonHDL: 164.63
Total CHOL/HDL Ratio: 4
Triglycerides: 74 mg/dL (ref 0.0–149.0)
VLDL: 14.8 mg/dL (ref 0.0–40.0)

## 2023-05-14 LAB — T4, FREE: Free T4: 0.67 ng/dL (ref 0.60–1.60)

## 2023-05-14 LAB — MICROALBUMIN / CREATININE URINE RATIO
Creatinine,U: 53.7 mg/dL
Microalb Creat Ratio: UNDETERMINED mg/g (ref 0.0–30.0)
Microalb, Ur: <0.7 mg/dL

## 2023-05-14 LAB — TSH: TSH: 0.32 u[IU]/mL — ABNORMAL LOW (ref 0.35–5.50)

## 2023-05-15 LAB — T3: T3, Total: 116 ng/dL (ref 76–181)

## 2023-05-17 ENCOUNTER — Encounter: Payer: Self-pay | Admitting: Family Medicine

## 2023-05-28 DIAGNOSIS — Z419 Encounter for procedure for purposes other than remedying health state, unspecified: Secondary | ICD-10-CM | POA: Diagnosis not present

## 2023-06-28 DIAGNOSIS — Z419 Encounter for procedure for purposes other than remedying health state, unspecified: Secondary | ICD-10-CM | POA: Diagnosis not present

## 2023-07-05 ENCOUNTER — Encounter: Payer: Self-pay | Admitting: *Deleted

## 2023-07-28 DIAGNOSIS — Z419 Encounter for procedure for purposes other than remedying health state, unspecified: Secondary | ICD-10-CM | POA: Diagnosis not present

## 2023-07-30 ENCOUNTER — Encounter: Payer: PRIVATE HEALTH INSURANCE | Admitting: Family Medicine

## 2023-08-28 DIAGNOSIS — Z419 Encounter for procedure for purposes other than remedying health state, unspecified: Secondary | ICD-10-CM | POA: Diagnosis not present

## 2023-09-21 ENCOUNTER — Encounter: Payer: Self-pay | Admitting: Family Medicine

## 2023-09-24 NOTE — Telephone Encounter (Signed)
 Plz schedule OV to review postmenopause syndrome therapies.

## 2023-09-26 ENCOUNTER — Other Ambulatory Visit: Payer: Self-pay | Admitting: Family Medicine

## 2023-09-26 DIAGNOSIS — Z1231 Encounter for screening mammogram for malignant neoplasm of breast: Secondary | ICD-10-CM

## 2023-09-28 DIAGNOSIS — Z419 Encounter for procedure for purposes other than remedying health state, unspecified: Secondary | ICD-10-CM | POA: Diagnosis not present

## 2023-10-02 NOTE — Telephone Encounter (Signed)
 lvm for pt to call office to schedule appt.

## 2023-10-03 ENCOUNTER — Other Ambulatory Visit: Payer: Self-pay | Admitting: Medical Genetics

## 2023-10-03 NOTE — Telephone Encounter (Signed)
 lvm for pt to call office to schedule appt.

## 2023-10-04 NOTE — Telephone Encounter (Signed)
 Pt called and schedule appt

## 2023-10-08 ENCOUNTER — Ambulatory Visit: Admitting: Family Medicine

## 2023-10-09 ENCOUNTER — Other Ambulatory Visit: Payer: Self-pay

## 2023-10-17 ENCOUNTER — Ambulatory Visit
Admission: RE | Admit: 2023-10-17 | Discharge: 2023-10-17 | Disposition: A | Discharge status: Home / Self Care | Source: Ambulatory Visit | Attending: Family Medicine | Admitting: Family Medicine

## 2023-10-17 DIAGNOSIS — Z1231 Encounter for screening mammogram for malignant neoplasm of breast: Secondary | ICD-10-CM

## 2023-10-19 ENCOUNTER — Ambulatory Visit: Payer: Self-pay | Admitting: Family Medicine

## 2023-10-28 DIAGNOSIS — Z419 Encounter for procedure for purposes other than remedying health state, unspecified: Secondary | ICD-10-CM | POA: Diagnosis not present

## 2023-11-01 ENCOUNTER — Ambulatory Visit: Payer: Self-pay

## 2023-11-01 NOTE — Telephone Encounter (Signed)
 FYI Only or Action Required?: FYI only for provider.  Patient was last seen in primary care on 05/11/2023 by Rilla Baller, MD.  Called Nurse Triage reporting Knee Pain.  Symptoms began a week ago.  Interventions attempted: OTC medications: tylenol, ice.  Symptoms are: unchanged.  Triage Disposition: See PCP When Office is Open (Within 3 Days)  Patient/caregiver understands and will follow disposition?: Yes   Copied from CRM #[id number]. Topic: Clinical - Red Word Triage >> Nov 01, 2023 11:42 AM Mia F wrote: Red Word that prompted transfer to Nurse Triage: pain in right knee. Making it hard to walk. There is some swelling. Pain is worsening. Been going on for about 2 weeks Reason for Disposition  [1] MODERATE pain (e.g., interferes with normal activities, limping) AND [2] present > 3 days  Answer Assessment - Initial Assessment Questions 1. LOCATION and RADIATION: Where is the pain located?      Right knee anterior and medial knee pain 2. QUALITY: What does the pain feel like?  (e.g., sharp, dull, aching, burning)     sharp 3. SEVERITY: How bad is the pain? What does it keep you from doing?   (Scale 1-10; or mild, moderate, severe)     6/10 4. ONSET: When did the pain start? Does it come and go, or is it there all the time?     Two weeks ago 5. RECURRENT: Have you had this pain before? If Yes, ask: When, and what happened then?     denies 6. SETTING: Has there been any recent work, exercise or other activity that involved that part of the body?      denies 7. AGGRAVATING FACTORS: What makes the knee pain worse? (e.g., walking, climbing stairs, running)     After activity/walking 8. ASSOCIATED SYMPTOMS: Is there any swelling or redness of the knee?     Mild swelling 9. OTHER SYMPTOMS: Do you have any other symptoms? (e.g., calf pain, chest pain, difficulty breathing, fever)     denies 10. PREGNANCY: Is there any chance you are pregnant? When was  your last menstrual period?       N/a  Protocols used: Knee Pain-A-AH

## 2023-11-01 NOTE — Telephone Encounter (Signed)
 Noted. Appreciate Dr Randeen seeing this nice pt tomorrow

## 2023-11-01 NOTE — Telephone Encounter (Signed)
 Will see patient then Agree with ER and UC precautions

## 2023-11-02 ENCOUNTER — Ambulatory Visit: Admitting: Family Medicine

## 2023-11-02 ENCOUNTER — Encounter: Payer: Self-pay | Admitting: Family Medicine

## 2023-11-02 VITALS — BP 122/78 | HR 64 | Temp 98.2°F | Ht 64.0 in | Wt 130.4 lb

## 2023-11-02 DIAGNOSIS — E0590 Thyrotoxicosis, unspecified without thyrotoxic crisis or storm: Secondary | ICD-10-CM | POA: Diagnosis not present

## 2023-11-02 DIAGNOSIS — Z90710 Acquired absence of both cervix and uterus: Secondary | ICD-10-CM

## 2023-11-02 DIAGNOSIS — M25561 Pain in right knee: Secondary | ICD-10-CM | POA: Diagnosis not present

## 2023-11-02 DIAGNOSIS — N951 Menopausal and female climacteric states: Secondary | ICD-10-CM | POA: Insufficient documentation

## 2023-11-02 MED ORDER — PROBIOTIC BLEND PO CAPS: ORAL_CAPSULE | ORAL | Status: AC

## 2023-11-02 MED ORDER — ESTRADIOL 0.5 MG PO TABS: 0.5000 mg | ORAL_TABLET | Freq: Every day | ORAL | 3 refills | Status: DC

## 2023-11-02 NOTE — Assessment & Plan Note (Signed)
 Presumed menopausal due to symptoms and age. She notes menopausal symptoms started at age 53yo. Unclear true menopause as she is s/p hysterectomy, but ovaries remained. Check FSH. Reviewed treatment options for menopausal symptoms including supplements (black cohosh, current Amberen), hormonal (estrogen) and non-hormonal therapies (antidepressants, veozah) as well as risk of each medication. Specifically reviewed HRT risks including but not limited to cardiovascular risk, thromboembolic risk, estrogen sensitive cancer related risk, and bone strengthening benefits. She previously was on estrogen 1mg  oral daily with benefit for several months. Desires to restart. Will start at lower 0.5mg  dose with option to increase to 1mg  if necessary. Update with effect.

## 2023-11-02 NOTE — Patient Instructions (Addendum)
 Laboratorois hoy para revisar tiroides.  Comienze estradiol  0.5-1mg  diario para sintomas - lo he mandado a Garment/textile technologist.   Creo que tiene quiste de Engineer, production en la rodilla derecha - use faja rodillera, hielo, eleve la pierna, y anti inflamatorio topical como voltaren (diclofenaco).  Si no mejora, seguimiento con Dr Watt o dejeme saber para terapia fisica.

## 2023-11-02 NOTE — Assessment & Plan Note (Signed)
 Knee pain and swelling isolated to right popliteal region most consistent with baker's cyst. Supportive measures reviewed - ice, elevation, compression, topical NSAID. Avoid oral NSAID in h/o kidney disease (PCKD)

## 2023-11-02 NOTE — Addendum Note (Signed)
 Addended by: HOPE VEVA PARAS on: 11/02/2023 04:03 PM   Modules accepted: Orders

## 2023-11-02 NOTE — Assessment & Plan Note (Signed)
 Update TFTs in h/o partialy hyperfunctioning L lower pole thyroid  nodule.  Latest thyroid  uptake scan was 2019. She declined treatment at that time.

## 2023-11-02 NOTE — Progress Notes (Signed)
 Ph: [phone] Fax: [phone]   Patient ID: Diane Hood, female    DOB: 03-19-1970, 53 y.o.   MRN: [id number]  This visit was conducted in person.  BP 122/78   Pulse 64   Temp 98.2 F (36.8 C) (Oral)   Ht 5' 4 (1.626 m)   Wt 130 lb 6 oz (59.1 kg)   SpO2 99%   BMI 22.38 kg/m    CC: several concerns  Subjective:   HPI: Diane Hood is a 53 y.o. female presenting on 11/02/2023 for Medication Consultation (Pt here to discuss Estradiol . And wants her R knee checked.)   LMP: 2013 (hysterectomy ovaries remained)  Perimenopause - hot flashes started at age 26.  S/p hysterectomy in 2013 at age 28yo due to heavy bleeding, ovaries remain.  Post-menopausal symptoms include hot flashes that wake her up.  She has tried 2 months of Amberen with vit E and other compounds supplement.  She was on estradiol  1mg  daily for 4 months with benefit back in 2023.   No significant risk or history of CAD.   2 wk h/o R knee pain - started anterior upper knee with radiation to thigh and to popliteal region. Denies inciting trauma/injury or fall. No locking or instability of knee. No redness warmth or swelling to knee. No other joints affected.  She continues walking 3 miles 4d/wk and yoga 2x/wk.  Treating with ice, topical biofreeze, tylenol 500mg  at night with benefit.   H/o partially hyperfunctioning L lower pole nodule by thyroid  uptake scan 2019.  Notes some intermittent discomfort to left throat.   She was remotely on 10 months of methotrexate 2013 for ?rheumatoid arthritis - this may have affected lung nodules - cancer scare, then told either cancer nor rheumatoid arthritis     Relevant past medical, surgical, family and social history reviewed and updated as indicated. Interim medical history since our last visit reviewed. Allergies and medications reviewed and updated. Outpatient Medications Prior to Visit  Medication Sig Dispense Refill   hydrocortisone  (ANUSOL -HC) 25 MG  suppository Place 1 suppository (25 mg total) rectally 2 (two) times daily. 12 suppository 1   valACYclovir  (VALTREX ) 1000 MG tablet Take 2 tablets (2,000 mg total) by mouth in the morning and at bedtime. For 1 day per cold sore flare 20 tablet 3   OVER THE COUNTER MEDICATION Take 2 mg by mouth daily. Amberen     OVER THE COUNTER MEDICATION Take 1 mg by mouth daily. Nature way probiotic     No facility-administered medications prior to visit.     Per HPI unless specifically indicated in ROS section below Review of Systems  Objective:  BP 122/78   Pulse 64   Temp 98.2 F (36.8 C) (Oral)   Ht 5' 4 (1.626 m)   Wt 130 lb 6 oz (59.1 kg)   SpO2 99%   BMI 22.38 kg/m   Wt Readings from Last 3 Encounters:  11/02/23 130 lb 6 oz (59.1 kg)  05/11/23 127 lb (57.6 kg)  04/28/22 127 lb (57.6 kg)      Physical Exam Vitals and nursing note reviewed.  Constitutional:      Appearance: Normal appearance. She is not ill-appearing.  HENT:     Mouth/Throat:     Mouth: Mucous membranes are moist.     Pharynx: Oropharynx is clear. No oropharyngeal exudate or posterior oropharyngeal erythema.  Eyes:     Extraocular Movements: Extraocular movements intact.     Pupils: Pupils are equal, round,  and reactive to light.  Neck:     Thyroid : No thyroid  mass, thyromegaly or thyroid  tenderness.  Cardiovascular:     Rate and Rhythm: Normal rate and regular rhythm.     Pulses: Normal pulses.     Heart sounds: Normal heart sounds. No murmur heard. Pulmonary:     Effort: Pulmonary effort is normal. No respiratory distress.     Breath sounds: Normal breath sounds. No wheezing, rhonchi or rales.  Musculoskeletal:     Cervical back: Normal range of motion and neck supple.     Right lower leg: No edema.     Left lower leg: No edema.     Comments:  Calf circumference equal bilaterally (32cm)  Left knee WNL Right knee exam: No deformity on inspection. Discomfort and fullness to palpation of popliteal  region  Otherwise no pain with palpation of knee landmarks. No effusion/swelling noted. FROM in flex/extension without crepitus. Neg drawer test. Neg mcmurray test. No pain with valgus/varus stress. No PFgrind. No abnormal patellar mobility.   Skin:    General: Skin is warm and dry.     Findings: No rash.  Neurological:     Mental Status: She is alert.  Psychiatric:        Mood and Affect: Mood normal.        Behavior: Behavior normal.       Results for orders placed or performed in visit on 05/14/23  T3   Collection Time: 05/14/23  8:09 AM  Result Value Ref Range   T3, Total 116 76 - 181 ng/dL  Microalbumin / creatinine urine ratio   Collection Time: 05/14/23  8:09 AM  Result Value Ref Range   Microalb, Ur <0.7 mg/dL   Creatinine,U 46.2 mg/dL   Microalb Creat Ratio Unable to calculate 0.0 - 30.0 mg/g  CBC with Differential/Platelet   Collection Time: 05/14/23  8:09 AM  Result Value Ref Range   WBC 6.4 4.0 - 10.5 K/uL   RBC 4.48 3.87 - 5.11 Mil/uL   Hemoglobin 13.2 12.0 - 15.0 g/dL   HCT 60.6 63.9 - 53.9 %   MCV 87.7 78.0 - 100.0 fl   MCHC 33.6 30.0 - 36.0 g/dL   RDW 86.4 88.4 - 84.4 %   Platelets 354.0 150.0 - 400.0 K/uL   Neutrophils Relative % 55.1 43.0 - 77.0 %   Lymphocytes Relative 35.8 12.0 - 46.0 %   Monocytes Relative 5.1 3.0 - 12.0 %   Eosinophils Relative 2.8 0.0 - 5.0 %   Basophils Relative 1.2 0.0 - 3.0 %   Neutro Abs 3.5 1.4 - 7.7 K/uL   Lymphs Abs 2.3 0.7 - 4.0 K/uL   Monocytes Absolute 0.3 0.1 - 1.0 K/uL   Eosinophils Absolute 0.2 0.0 - 0.7 K/uL   Basophils Absolute 0.1 0.0 - 0.1 K/uL  T4, free   Collection Time: 05/14/23  8:09 AM  Result Value Ref Range   Free T4 0.67 0.60 - 1.60 ng/dL  TSH   Collection Time: 05/14/23  8:09 AM  Result Value Ref Range   TSH 0.32 (L) 0.35 - 5.50 uIU/mL  Comprehensive metabolic panel with GFR   Collection Time: 05/14/23  8:09 AM  Result Value Ref Range   Sodium 142 135 - 145 mEq/L   Potassium 4.3 3.5 - 5.1  mEq/L   Chloride 108 96 - 112 mEq/L   CO2 26 19 - 32 mEq/L   Glucose, Bld 96 70 - 99 mg/dL   BUN 14 6 -  23 mg/dL   Creatinine, Ser 9.09 0.40 - 1.20 mg/dL   Total Bilirubin 0.8 0.2 - 1.2 mg/dL   Alkaline Phosphatase 63 39 - 117 U/L   AST 22 0 - 37 U/L   ALT 20 0 - 35 U/L   Total Protein 7.2 6.0 - 8.3 g/dL   Albumin 4.6 3.5 - 5.2 g/dL   GFR 27.11 >39.99 mL/min   Calcium  9.6 8.4 - 10.5 mg/dL  Lipid panel   Collection Time: 05/14/23  8:09 AM  Result Value Ref Range   Cholesterol 224 (H) 0 - 200 mg/dL   Triglycerides 25.9 0.0 - 149.0 mg/dL   HDL 40.19 >60.99 mg/dL   VLDL 85.1 0.0 - 59.9 mg/dL   LDL Cholesterol 849 (H) 0 - 99 mg/dL   Total CHOL/HDL Ratio 4    NonHDL 164.63       11/02/2023   12:47 PM 05/11/2023    2:14 PM 04/28/2022   12:12 PM 04/19/2021   10:06 AM 09/12/2019   10:18 AM  Depression screen PHQ 2/9  Decreased Interest 2 1 2 1  0  Down, Depressed, Hopeless 2 1 1 1  0  PHQ - 2 Score 4 2 3 2  0  Altered sleeping 2 1 1 1    Tired, decreased energy 2 2 1 1    Change in appetite 2 0 0 0   Feeling bad or failure about yourself  1 0 0 0   Trouble concentrating 1 0 0 0   Moving slowly or fidgety/restless 0 0 0 0   Suicidal thoughts 0 0 0 0   PHQ-9 Score 12 5 5 4    Difficult doing work/chores Somewhat difficult Somewhat difficult Somewhat difficult Somewhat difficult       11/02/2023   12:48 PM 05/11/2023    2:14 PM 04/28/2022   12:12 PM 04/19/2021   10:06 AM  GAD 7 : Generalized Anxiety Score  Nervous, Anxious, on Edge 2 2 1 1   Control/stop worrying 1 1 1  0  Worry too much - different things 1 1 1  0  Trouble relaxing 1 1 1 1   Restless 0 0 0 0  Easily annoyed or irritable 1 0 0 0  Afraid - awful might happen 1 0 0 0  Total GAD 7 Score 7 5 4 2   Anxiety Difficulty Somewhat difficult Somewhat difficult Somewhat difficult Somewhat difficult   Assessment & Plan:   Problem List Items Addressed This Visit     Subclinical hyperthyroidism - Primary   Update TFTs in h/o  partialy hyperfunctioning L lower pole thyroid  nodule.  Latest thyroid  uptake scan was 2019. She declined treatment at that time.       Relevant Orders   TSH   T3   T4, free   History of laparoscopic-assisted vaginal hysterectomy   Right knee pain   Knee pain and swelling isolated to right popliteal region most consistent with baker's cyst. Supportive measures reviewed - ice, elevation, compression, topical NSAID. Avoid oral NSAID in h/o kidney disease (PCKD)      Hot flashes due to menopause   Presumed menopausal due to symptoms and age. She notes menopausal symptoms started at age 3yo. Unclear true menopause as she is s/p hysterectomy, but ovaries remained. Check FSH. Reviewed treatment options for menopausal symptoms including supplements (black cohosh, current Amberen), hormonal (estrogen) and non-hormonal therapies (antidepressants, veozah) as well as risk of each medication. Specifically reviewed HRT risks including but not limited to cardiovascular risk, thromboembolic risk, estrogen sensitive cancer related  risk, and bone strengthening benefits. She previously was on estrogen 1mg  oral daily with benefit for several months. Desires to restart. Will start at lower 0.5mg  dose with option to increase to 1mg  if necessary. Update with effect.       Relevant Orders   Follicle stimulating hormone     Meds ordered this encounter  Medications   Probiotic Product (PROBIOTIC BLEND) CAPS    Sig: 1Billion CFU   estradiol  (ESTRACE ) 0.5 MG tablet    Sig: Take 1 tablet (0.5 mg total) by mouth daily.    Dispense:  30 tablet    Refill:  3    Orders Placed This Encounter  Procedures   Follicle stimulating hormone   TSH   T3   T4, free    Patient Instructions  Laboratorois hoy para revisar tiroides.  Comienze estradiol  0.5-1mg  diario para sintomas - lo he mandado a Garment/textile technologist.   Creo que tiene quiste de Engineer, production en la rodilla derecha - use faja rodillera, hielo, eleve la pierna, y  anti inflamatorio topical como voltaren (diclofenaco).  Si no mejora, seguimiento con Dr Watt o dejeme saber para terapia fisica.   Follow up plan: Return if symptoms worsen or fail to improve.  Anton Blas, MD

## 2023-11-03 LAB — T3: T3, Total: 113 ng/dL (ref 76–181)

## 2023-11-03 LAB — T4, FREE: Free T4: 1.1 ng/dL (ref 0.8–1.8)

## 2023-11-03 LAB — FOLLICLE STIMULATING HORMONE: FSH: 199.7 m[IU]/mL — ABNORMAL HIGH

## 2023-11-03 LAB — TSH: TSH: 0.29 m[IU]/L — ABNORMAL LOW

## 2023-11-05 ENCOUNTER — Other Ambulatory Visit
Admission: RE | Admit: 2023-11-05 | Discharge: 2023-11-05 | Disposition: A | Discharge status: Home / Self Care | Payer: Self-pay | Source: Ambulatory Visit | Attending: Medical Genetics | Admitting: Medical Genetics

## 2023-11-10 ENCOUNTER — Ambulatory Visit: Payer: Self-pay | Admitting: Family Medicine

## 2023-11-10 DIAGNOSIS — E041 Nontoxic single thyroid nodule: Secondary | ICD-10-CM

## 2023-11-10 DIAGNOSIS — R4589 Other symptoms and signs involving emotional state: Secondary | ICD-10-CM

## 2023-11-10 DIAGNOSIS — E0590 Thyrotoxicosis, unspecified without thyrotoxic crisis or storm: Secondary | ICD-10-CM

## 2023-11-10 MED ORDER — ESTRADIOL 1 MG PO TABS: 1.0000 mg | ORAL_TABLET | Freq: Every day | ORAL | 6 refills | Status: DC

## 2023-11-10 NOTE — Telephone Encounter (Signed)
 See mychart message -ongoing hot flashes despite estradiol  0.5mg  daily - will increase dose to 1mg  daily.

## 2023-11-14 NOTE — Telephone Encounter (Signed)
 Endo referral placed.

## 2023-11-14 NOTE — Addendum Note (Signed)
 Addended by: RILLA BALLER on: 11/14/2023 07:11 AM   Modules accepted: Orders

## 2023-11-21 ENCOUNTER — Ambulatory Visit: Admitting: Family Medicine

## 2023-11-21 LAB — GENECONNECT MOLECULAR SCREEN: Genetic Analysis Overall Interpretation: NEGATIVE

## 2023-12-03 ENCOUNTER — Other Ambulatory Visit: Payer: Self-pay | Admitting: Family Medicine

## 2023-12-04 DIAGNOSIS — R4589 Other symptoms and signs involving emotional state: Secondary | ICD-10-CM | POA: Insufficient documentation

## 2023-12-04 MED ORDER — DULOXETINE HCL 20 MG PO CPEP: 20.0000 mg | ORAL_CAPSULE | Freq: Every day | ORAL | 6 refills | Status: DC

## 2023-12-04 NOTE — Telephone Encounter (Addendum)
 Spoke with patient.  Depressive symptoms despite yoga, regular walking, Anhedonia, low energy, sadness, oversleeping, decreased appetite, no SI/HI.  Recurrent symptoms over past few weeks.  Previously on sertraline  then duloxetine  with better effect.  Depressed mood started during divorce 2 yrs ago.  Restart duloxetine  20mg  daily.  Hot flashes are improved with estradiol  1mg  daily.   Recurrent R knee pain - managing with topical voltaren. Recurrent symptoms 2 days ago. She will return to see sports med for this.

## 2023-12-04 NOTE — Addendum Note (Signed)
 Addended by: RILLA BALLER on: 12/04/2023 09:52 AM   Modules accepted: Orders

## 2023-12-04 NOTE — Telephone Encounter (Signed)
 Can we check on endo referral status? Referral placed 11/14/2023

## 2023-12-05 NOTE — Telephone Encounter (Signed)
 Message sent in stat teams to have reviewed hold for answer

## 2023-12-24 ENCOUNTER — Other Ambulatory Visit (HOSPITAL_COMMUNITY)
Admission: RE | Admit: 2023-12-24 | Discharge: 2023-12-24 | Disposition: A | Discharge status: Home / Self Care | Source: Ambulatory Visit | Attending: Advanced Practice Midwife | Admitting: Advanced Practice Midwife

## 2023-12-24 ENCOUNTER — Encounter: Payer: Self-pay | Admitting: Advanced Practice Midwife

## 2023-12-24 ENCOUNTER — Ambulatory Visit: Admitting: Advanced Practice Midwife

## 2023-12-24 VITALS — BP 129/78 | HR 66 | Wt 134.4 lb

## 2023-12-24 DIAGNOSIS — G8929 Other chronic pain: Secondary | ICD-10-CM | POA: Diagnosis not present

## 2023-12-24 DIAGNOSIS — R109 Unspecified abdominal pain: Secondary | ICD-10-CM | POA: Diagnosis not present

## 2023-12-24 DIAGNOSIS — N898 Other specified noninflammatory disorders of vagina: Secondary | ICD-10-CM | POA: Diagnosis not present

## 2023-12-24 DIAGNOSIS — Z8742 Personal history of other diseases of the female genital tract: Secondary | ICD-10-CM

## 2023-12-24 DIAGNOSIS — Z90710 Acquired absence of both cervix and uterus: Secondary | ICD-10-CM

## 2023-12-24 DIAGNOSIS — Z114 Encounter for screening for human immunodeficiency virus [HIV]: Secondary | ICD-10-CM

## 2023-12-24 DIAGNOSIS — Z113 Encounter for screening for infections with a predominantly sexual mode of transmission: Secondary | ICD-10-CM | POA: Diagnosis not present

## 2023-12-24 DIAGNOSIS — N951 Menopausal and female climacteric states: Secondary | ICD-10-CM

## 2023-12-24 DIAGNOSIS — Z01419 Encounter for gynecological examination (general) (routine) without abnormal findings: Secondary | ICD-10-CM

## 2023-12-24 DIAGNOSIS — Z124 Encounter for screening for malignant neoplasm of cervix: Secondary | ICD-10-CM

## 2023-12-24 NOTE — Progress Notes (Unsigned)
 GYNECOLOGY OFFICE VISIT NOTE  History:   Discussed the use of AI scribe software for clinical note transcription with the patient, who gave verbal consent to proceed.  History of Present Illness A 53 year old female who presents for her annual gynecology exam and Pap smear.  She is taking estrogen pills for hot flashes, which have improved. Since starting estrogen, she has noticed an abnormal white or yellow vaginal discharge.  She had a hysterectomy including removal of the cervix in 2013 for heavy bleeding. She had a precancerous cervical condition in 1997 treated with cryotherapy and does not recall abnormal Pap smears since.  She is not currently sexually active. She had significant pain with intercourse more than three years ago, which she attributed to menopause-related changes. She has no current vaginal dryness.  She has chronic abdominal bloating and discomfort after eating for several years. Prior testing and medications w/ GI and Gyn more than three years ago did not resolve symptoms. She has a history of ovarian cysts with surgery about 15 years ago and recurrence of cysts soon after. Pain is sometimes worse on the right side.  She has no new breast lumps, skin changes, or nipple retraction.   She denies any abnormal vaginal discharge, bleeding, pelvic pain or other concerns.    Past Medical History:  Diagnosis Date   Abnormal Pap smear of cervix 1997   Tx w/ cryo   Arthritis 01/2011   hospitalization for this - saw rheum - treated with prednisone  x 10 months   Chronic kidney disease    Depression    Polycystic kidney disease    Renal insufficiency    Thyroid  nodule 2019   focal increased uptake L lower lobe     Past Surgical History:  Procedure Laterality Date   ABDOMINAL HYSTERECTOMY     CERVIX LESION DESTRUCTION  1997   Cryosurgery for abnormal Pap. Nml Paps since then.   COLONOSCOPY WITH PROPOFOL  N/A 03/01/2021   unremarkable, biopsies WNL Romero, Ruel,  MD)   ESOPHAGOGASTRODUODENOSCOPY (EGD) WITH PROPOFOL  N/A 10/29/2020   Procedure: ESOPHAGOGASTRODUODENOSCOPY (EGD) WITH PROPOFOL ;  Surgeon: Therisa Ruel, MD;  [facility] END[facility]COPY;  Service: Gastroenterology;  Laterality: N/A;   LAPAROSCOPIC OVARIAN CYSTECTOMY     unsure of side. done before her TLH.   TONSILLECTOMY  1994   TOTAL LAPAROSCOPIC HYSTERECTOMY WITH SALPINGECTOMY  2013   For bleeding. Pt reports cervix removed. Ovaries NOT removed.    The following portions of the patient's history were reviewed and updated as appropriate: allergies, current medications, past family history, past medical history, past social history, past surgical history and problem list.   Health Maintenance:  Normal pap and negative HRHPV on 2022.  Normal mammogram on 10/2023.     Component Value Date/Time   DIAGPAP  12/28/2020 1443    - Negative for intraepithelial lesion or malignancy (NILM)   HPVHIGH Negative 12/28/2020 1443   ADEQPAP Satisfactory for evaluation. 12/28/2020 1443     Review of Systems:  Pertinent items noted in HPI and remainder of comprehensive ROS otherwise negative.  Physical Exam:  BP 129/78   Pulse 66   Wt 134 lb 6 oz (61 kg)   BMI 23.07 kg/m  CONSTITUTIONAL: Well-developed, well-nourished female in no acute distress.  HEENT:  Normocephalic, atraumatic.  No scleral icterus.  NECK: Normal range of motion, supple, no masses noted on observation SKIN: No rash noted. Not diaphoretic. No erythema. No pallor. MUSCULOSKELETAL: Normal range of motion. No edema noted. NEUROLOGIC: Alert  and oriented to person, place, and time. Normal muscle tone coordination.  PSYCHIATRIC: Normal mood and affect. Normal behavior. Normal judgment and thought content. CARDIOVASCULAR: Normal heart rate noted RESPIRATORY: Effort and breath sounds normal, no problems with respiration noted ABDOMEN: No masses or other overt distention noted on observation, no tenderness.   Breast: declined  exam PELVIC: Normal appearing external genitalia; normal urethral meatus; normal appearing vaginal mucosa. No cervix visualized.  Small amount of yellow, odorless discharge noted. Uterine surgically absent. No other palpable masses or adnexal tenderness. Performed in the presence of a chaperone.  Labs and Imaging No results found for this or any previous visit (from the past week). No results found.    Assessment and Plan:     Assessment and Plan Assessment & Plan Well Woman Visit Annual gynecology exam conducted. Mammogram up to date and normal. Pap smear performed due to history of abnormal Pap smear and cryotherapy. Explained continued need for Pap smears until age 60 despite hysterectomy. - Performed Pap smear. - Continue Pap smears until age 13.  Menopausal symptoms managed with estrogen therapy. Rx'd by PCP.  Menopausal symptoms, specifically hot flashes, well-managed with estrogen therapy. Discussed benefits and risks of hormone therapy, noting newer studies suggest more benefits and fewer risks when started closer to menopause. - Continue estrogen therapy.  Vaginal discharge, possible infection under evaluation White or yellow vaginal discharge noted after starting estrogen therapy. Differential diagnosis includes yeast infection or bacterial vaginosis. Estrogen therapy not typically associated with this discharge, suggesting possible infection. - Performed swab for yeast and bacterial vaginosis. - Await results in 2-3 days.  Chronic abdominal bloating and pain, under evaluation Chronic abdominal bloating and pain with previous workup showing no significant findings. Symptoms persist. Discussed possibility of ovarian cysts and cancer risk in post-menopausal cysts. - Ordered pelvic ultrasound to evaluate ovaries. - Scheduled ultrasound at a convenient location.  1. Well woman exam with routine gynecological exam (Primary) - Cytology - PAP( [place])  2. History of  laparoscopic-assisted vaginal hysterectomy  3. Vaginal discharge  4. Menopausal symptoms  5. Cervical cancer screening - Cytology - PAP( [place])  6. Chronic abdominal pain - US  PELVIC COMPLETE WITH TRANSVAGINAL; Future  7. History of ovarian cyst - US  PELVIC COMPLETE WITH TRANSVAGINAL; Future  8. Routine screening for STI (sexually transmitted infection) - RPR - HIV antibody (with reflex) - Hepatitis B surface antigen - Hepatitis C Antibody - Cervicovaginal ancillary only( [place])  9. Encounter for screening for HIV - HIV antibody (with reflex)   Routine preventative health maintenance measures emphasized. Please refer to After Visit Summary for other counseling recommendations.   Return in about 1 year (around 12/23/2024) for Annual Gynecology appointment.   Schedule Pelvic US .   Ewald Beg  Claudene, CNM 12/24/2023 4:07 PM [facility] [employer], [facility]

## 2023-12-25 ENCOUNTER — Ambulatory Visit: Payer: Self-pay | Admitting: Advanced Practice Midwife

## 2023-12-25 LAB — HEPATITIS B SURFACE ANTIGEN: Hepatitis B Surface Ag: NEGATIVE

## 2023-12-25 LAB — HIV ANTIBODY (ROUTINE TESTING W REFLEX): HIV Screen 4th Generation wRfx: NONREACTIVE

## 2023-12-25 LAB — SYPHILIS: RPR W/REFLEX TO RPR TITER AND TREPONEMAL ANTIBODIES, TRADITIONAL SCREENING AND DIAGNOSIS ALGORITHM: RPR Ser Ql: NONREACTIVE

## 2023-12-25 LAB — HEPATITIS C ANTIBODY: Hep C Virus Ab: NONREACTIVE

## 2023-12-26 ENCOUNTER — Ambulatory Visit
Admission: RE | Admit: 2023-12-26 | Discharge: 2023-12-26 | Attending: Advanced Practice Midwife | Admitting: Advanced Practice Midwife

## 2023-12-26 DIAGNOSIS — R109 Unspecified abdominal pain: Secondary | ICD-10-CM | POA: Diagnosis not present

## 2023-12-26 DIAGNOSIS — Z8742 Personal history of other diseases of the female genital tract: Secondary | ICD-10-CM | POA: Insufficient documentation

## 2023-12-26 DIAGNOSIS — G8929 Other chronic pain: Secondary | ICD-10-CM | POA: Insufficient documentation

## 2023-12-26 DIAGNOSIS — Z90710 Acquired absence of both cervix and uterus: Secondary | ICD-10-CM | POA: Diagnosis not present

## 2023-12-26 DIAGNOSIS — R140 Abdominal distension (gaseous): Secondary | ICD-10-CM | POA: Diagnosis not present

## 2023-12-26 DIAGNOSIS — R1030 Lower abdominal pain, unspecified: Secondary | ICD-10-CM | POA: Diagnosis not present

## 2023-12-26 LAB — CERVICOVAGINAL ANCILLARY ONLY
Bacterial Vaginitis (gardnerella): POSITIVE — AB
Candida Glabrata: NEGATIVE
Candida Vaginitis: NEGATIVE
Chlamydia: NEGATIVE
Comment: NEGATIVE
Comment: NEGATIVE
Comment: NEGATIVE
Comment: NEGATIVE
Comment: NEGATIVE
Comment: NORMAL
Neisseria Gonorrhea: NEGATIVE
Trichomonas: NEGATIVE

## 2023-12-27 ENCOUNTER — Other Ambulatory Visit: Payer: Self-pay | Admitting: Family Medicine

## 2023-12-27 NOTE — Telephone Encounter (Signed)
 Last Assessment & Plan NoteEdited:Gutierrez, Anton, MD4/26/2025 10:42 AM She has been slowly weaning off cymbalta  - down to 30mg  a few times a week. Discussed ok to fully stop at this time.  Unsure if you wish to refill, please advise

## 2023-12-28 DIAGNOSIS — Z419 Encounter for procedure for purposes other than remedying health state, unspecified: Secondary | ICD-10-CM | POA: Diagnosis not present

## 2023-12-28 LAB — CYTOLOGY - PAP
Comment: NEGATIVE
Diagnosis: UNDETERMINED — AB
High risk HPV: NEGATIVE

## 2023-12-29 NOTE — Telephone Encounter (Signed)
 From mychart message 12/04/2023: Depressive symptoms despite yoga, regular walking, Anhedonia, low energy, sadness, oversleeping, decreased appetite, no SI/HI.  Recurrent symptoms over past few weeks.  Previously on sertraline  then duloxetine  with better effect.  Depressed mood started during divorce 2 yrs ago.  Restart duloxetine  20mg  daily.   ERx duloxetine  90d supply

## 2024-01-03 ENCOUNTER — Encounter: Payer: Self-pay | Admitting: Advanced Practice Midwife

## 2024-01-09 DIAGNOSIS — E0510 Thyrotoxicosis with toxic single thyroid nodule without thyrotoxic crisis or storm: Secondary | ICD-10-CM | POA: Diagnosis not present

## 2024-01-09 DIAGNOSIS — E042 Nontoxic multinodular goiter: Secondary | ICD-10-CM | POA: Diagnosis not present

## 2024-01-09 DIAGNOSIS — E0590 Thyrotoxicosis, unspecified without thyrotoxic crisis or storm: Secondary | ICD-10-CM | POA: Diagnosis not present
